# Patient Record
Sex: Male | Born: 1970 | Hispanic: No | Smoking: Never smoker
Health system: Southern US, Community
[De-identification: ages and names within clinical notes are randomized; demographics above are authoritative.]

## PROBLEM LIST (undated history)

## (undated) DIAGNOSIS — G8929 Other chronic pain: Secondary | ICD-10-CM

## (undated) DIAGNOSIS — K219 Gastro-esophageal reflux disease without esophagitis: Secondary | ICD-10-CM

## (undated) DIAGNOSIS — J45909 Unspecified asthma, uncomplicated: Secondary | ICD-10-CM

## (undated) DIAGNOSIS — J309 Allergic rhinitis, unspecified: Secondary | ICD-10-CM

## (undated) DIAGNOSIS — E669 Obesity, unspecified: Secondary | ICD-10-CM

## (undated) DIAGNOSIS — I10 Essential (primary) hypertension: Secondary | ICD-10-CM

## (undated) DIAGNOSIS — M1990 Unspecified osteoarthritis, unspecified site: Secondary | ICD-10-CM

## (undated) DIAGNOSIS — G4730 Sleep apnea, unspecified: Secondary | ICD-10-CM

## (undated) DIAGNOSIS — F419 Anxiety disorder, unspecified: Secondary | ICD-10-CM

## (undated) DIAGNOSIS — G2581 Restless legs syndrome: Secondary | ICD-10-CM

## (undated) DIAGNOSIS — IMO0001 Reserved for inherently not codable concepts without codable children: Secondary | ICD-10-CM

## (undated) DIAGNOSIS — R51 Headache: Secondary | ICD-10-CM

## (undated) DIAGNOSIS — D649 Anemia, unspecified: Secondary | ICD-10-CM

## (undated) DIAGNOSIS — T7840XA Allergy, unspecified, initial encounter: Secondary | ICD-10-CM

## (undated) DIAGNOSIS — I499 Cardiac arrhythmia, unspecified: Secondary | ICD-10-CM

## (undated) DIAGNOSIS — R519 Headache, unspecified: Secondary | ICD-10-CM

## (undated) DIAGNOSIS — M25522 Pain in left elbow: Secondary | ICD-10-CM

## (undated) HISTORY — DX: Obesity, unspecified: E66.9

## (undated) HISTORY — DX: Essential (primary) hypertension: I10

## (undated) HISTORY — DX: Anemia, unspecified: D64.9

## (undated) HISTORY — DX: Unspecified asthma, uncomplicated: J45.909

## (undated) HISTORY — DX: Pain in left elbow: M25.522

## (undated) HISTORY — DX: Sleep apnea, unspecified: G47.30

## (undated) HISTORY — DX: Allergic rhinitis, unspecified: J30.9

## (undated) HISTORY — DX: Restless legs syndrome: G25.81

## (undated) HISTORY — DX: Allergy, unspecified, initial encounter: T78.40XA

## (undated) HISTORY — DX: Anxiety disorder, unspecified: F41.9

## (undated) HISTORY — DX: Other chronic pain: G89.29

---
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
## 1998-10-01 ENCOUNTER — Emergency Department (HOSPITAL_COMMUNITY): Admission: EM | Admit: 1998-10-01 | Discharge: 1998-10-01 | Payer: Self-pay

## 1998-10-04 ENCOUNTER — Emergency Department (HOSPITAL_COMMUNITY): Admission: EM | Admit: 1998-10-04 | Discharge: 1998-10-05 | Payer: Self-pay | Admitting: Emergency Medicine

## 1998-10-05 ENCOUNTER — Encounter (HOSPITAL_COMMUNITY): Admission: RE | Admit: 1998-10-05 | Discharge: 1999-01-03 | Payer: Self-pay | Admitting: Orthopedic Surgery

## 2001-12-02 HISTORY — PX: CRANIOTOMY: SHX93

## 2004-04-30 ENCOUNTER — Emergency Department (HOSPITAL_COMMUNITY): Admission: EM | Admit: 2004-04-30 | Discharge: 2004-05-01 | Payer: Self-pay | Admitting: Emergency Medicine

## 2004-11-29 ENCOUNTER — Emergency Department: Payer: Self-pay | Admitting: Emergency Medicine

## 2005-09-18 ENCOUNTER — Emergency Department: Payer: Self-pay | Admitting: Emergency Medicine

## 2005-09-20 ENCOUNTER — Ambulatory Visit: Payer: Self-pay | Admitting: Urology

## 2005-09-23 ENCOUNTER — Ambulatory Visit: Payer: Self-pay | Admitting: Urology

## 2005-09-26 ENCOUNTER — Ambulatory Visit: Payer: Self-pay | Admitting: Urology

## 2005-12-02 HISTORY — PX: ELBOW FRACTURE SURGERY: SHX616

## 2006-04-27 ENCOUNTER — Emergency Department (HOSPITAL_COMMUNITY): Admission: EM | Admit: 2006-04-27 | Discharge: 2006-04-27 | Payer: Self-pay | Admitting: Emergency Medicine

## 2006-05-17 ENCOUNTER — Emergency Department: Payer: Self-pay | Admitting: Unknown Physician Specialty

## 2006-05-21 ENCOUNTER — Ambulatory Visit: Payer: Self-pay | Admitting: Unknown Physician Specialty

## 2006-05-22 ENCOUNTER — Ambulatory Visit: Payer: Self-pay | Admitting: Unknown Physician Specialty

## 2007-10-29 ENCOUNTER — Emergency Department (HOSPITAL_COMMUNITY): Admission: EM | Admit: 2007-10-29 | Discharge: 2007-10-29 | Payer: Self-pay | Admitting: Emergency Medicine

## 2008-05-14 ENCOUNTER — Emergency Department: Payer: Self-pay | Admitting: Emergency Medicine

## 2008-05-15 ENCOUNTER — Ambulatory Visit: Payer: Self-pay | Admitting: Emergency Medicine

## 2008-09-29 ENCOUNTER — Emergency Department (HOSPITAL_COMMUNITY): Admission: EM | Admit: 2008-09-29 | Discharge: 2008-09-29 | Payer: Self-pay | Admitting: Emergency Medicine

## 2010-01-04 ENCOUNTER — Encounter
Admission: RE | Admit: 2010-01-04 | Discharge: 2010-01-04 | Payer: Self-pay | Admitting: Physical Medicine & Rehabilitation

## 2010-03-12 ENCOUNTER — Emergency Department: Payer: Self-pay | Admitting: Emergency Medicine

## 2010-10-25 ENCOUNTER — Emergency Department: Payer: Self-pay | Admitting: Emergency Medicine

## 2011-05-20 ENCOUNTER — Ambulatory Visit: Payer: Self-pay | Admitting: General Practice

## 2011-10-22 ENCOUNTER — Ambulatory Visit: Payer: Self-pay | Admitting: Urology

## 2011-10-28 ENCOUNTER — Ambulatory Visit: Payer: Self-pay | Admitting: Urology

## 2011-11-04 ENCOUNTER — Ambulatory Visit: Payer: Self-pay | Admitting: Urology

## 2011-12-16 ENCOUNTER — Ambulatory Visit: Payer: Self-pay | Admitting: Surgery

## 2011-12-20 ENCOUNTER — Ambulatory Visit: Payer: Self-pay | Admitting: Surgery

## 2012-04-02 ENCOUNTER — Ambulatory Visit: Payer: Self-pay | Admitting: General Practice

## 2013-04-19 ENCOUNTER — Emergency Department: Payer: Self-pay | Admitting: Emergency Medicine

## 2014-04-28 ENCOUNTER — Ambulatory Visit: Payer: Self-pay | Admitting: Gastroenterology

## 2014-04-28 HISTORY — PX: COLONOSCOPY WITH ESOPHAGOGASTRODUODENOSCOPY (EGD): SHX5779

## 2014-06-29 ENCOUNTER — Ambulatory Visit: Payer: Self-pay | Admitting: Internal Medicine

## 2014-07-02 ENCOUNTER — Ambulatory Visit: Payer: Self-pay | Admitting: Internal Medicine

## 2014-08-02 ENCOUNTER — Ambulatory Visit: Payer: Self-pay | Admitting: Internal Medicine

## 2014-08-24 DIAGNOSIS — I10 Essential (primary) hypertension: Secondary | ICD-10-CM | POA: Insufficient documentation

## 2014-08-24 DIAGNOSIS — I471 Supraventricular tachycardia: Secondary | ICD-10-CM | POA: Insufficient documentation

## 2014-08-25 DIAGNOSIS — E782 Mixed hyperlipidemia: Secondary | ICD-10-CM | POA: Insufficient documentation

## 2014-09-20 ENCOUNTER — Ambulatory Visit: Payer: Self-pay | Admitting: Internal Medicine

## 2014-09-21 LAB — CBC CANCER CENTER
Basophil #: 0.1 x10 3/mm (ref 0.0–0.1)
Basophil %: 1.2 %
Eosinophil #: 0.1 x10 3/mm (ref 0.0–0.7)
Eosinophil %: 3 %
HCT: 39.6 % — ABNORMAL LOW (ref 40.0–52.0)
HGB: 12.5 g/dL — ABNORMAL LOW (ref 13.0–18.0)
Lymphocyte #: 1.5 x10 3/mm (ref 1.0–3.6)
Lymphocyte %: 29.1 %
MCH: 26 pg (ref 26.0–34.0)
MCHC: 31.5 g/dL — ABNORMAL LOW (ref 32.0–36.0)
MCV: 82 fL (ref 80–100)
Monocyte #: 0.5 x10 3/mm (ref 0.2–1.0)
Monocyte %: 9.4 %
Neutrophil #: 2.9 x10 3/mm (ref 1.4–6.5)
Neutrophil %: 57.3 %
Platelet: 188 x10 3/mm (ref 150–440)
RBC: 4.8 10*6/uL (ref 4.40–5.90)
RDW: 20.4 % — ABNORMAL HIGH (ref 11.5–14.5)
WBC: 5 x10 3/mm (ref 3.8–10.6)

## 2014-09-21 LAB — IRON AND TIBC
Iron Bind.Cap.(Total): 427 ug/dL (ref 250–450)
Iron Saturation: 9 %
Iron: 37 ug/dL — ABNORMAL LOW (ref 65–175)
Unbound Iron-Bind.Cap.: 390 ug/dL

## 2014-09-21 LAB — FERRITIN: Ferritin (ARMC): 7 ng/mL — ABNORMAL LOW (ref 8–388)

## 2014-09-21 LAB — FOLATE: Folic Acid: 18.8 ng/mL (ref 3.1–100.0)

## 2014-10-02 ENCOUNTER — Ambulatory Visit: Payer: Self-pay | Admitting: Internal Medicine

## 2014-11-01 ENCOUNTER — Ambulatory Visit: Payer: Self-pay | Admitting: Internal Medicine

## 2014-12-27 ENCOUNTER — Ambulatory Visit: Payer: Self-pay | Admitting: Internal Medicine

## 2014-12-27 LAB — IRON AND TIBC
Iron Bind.Cap.(Total): 395 ug/dL (ref 250–450)
Iron Saturation: 20 %
Iron: 78 ug/dL (ref 65–175)
Unbound Iron-Bind.Cap.: 317 ug/dL

## 2014-12-27 LAB — CBC CANCER CENTER
Basophil #: 0.1 x10 3/mm (ref 0.0–0.1)
Basophil %: 1 %
Eosinophil #: 0.2 x10 3/mm (ref 0.0–0.7)
Eosinophil %: 2.9 %
HCT: 42.3 % (ref 40.0–52.0)
HGB: 13.6 g/dL (ref 13.0–18.0)
Lymphocyte #: 1.5 x10 3/mm (ref 1.0–3.6)
Lymphocyte %: 22.7 %
MCH: 28.9 pg (ref 26.0–34.0)
MCHC: 32.3 g/dL (ref 32.0–36.0)
MCV: 90 fL (ref 80–100)
Monocyte #: 0.6 x10 3/mm (ref 0.2–1.0)
Monocyte %: 8.7 %
Neutrophil #: 4.3 x10 3/mm (ref 1.4–6.5)
Neutrophil %: 64.7 %
Platelet: 202 x10 3/mm (ref 150–440)
RBC: 4.72 10*6/uL (ref 4.40–5.90)
RDW: 15.4 % — ABNORMAL HIGH (ref 11.5–14.5)
WBC: 6.7 x10 3/mm (ref 3.8–10.6)

## 2014-12-27 LAB — FERRITIN: Ferritin (ARMC): 31 ng/mL (ref 8–388)

## 2015-01-02 ENCOUNTER — Ambulatory Visit: Payer: Self-pay | Admitting: Internal Medicine

## 2015-05-12 ENCOUNTER — Other Ambulatory Visit: Payer: Self-pay

## 2015-05-12 MED ORDER — ONDANSETRON 4 MG PO TBDP: 4.0000 mg | ORAL_TABLET | Freq: Four times a day (QID) | ORAL | Status: DC | PRN

## 2015-05-12 MED ORDER — RIZATRIPTAN BENZOATE 10 MG PO TBDP: ORAL_TABLET | ORAL | Status: DC

## 2015-05-12 NOTE — Telephone Encounter (Signed)
Pt needs appt if having this many migraines

## 2015-05-22 DIAGNOSIS — J45909 Unspecified asthma, uncomplicated: Secondary | ICD-10-CM | POA: Insufficient documentation

## 2015-05-22 DIAGNOSIS — J309 Allergic rhinitis, unspecified: Secondary | ICD-10-CM | POA: Insufficient documentation

## 2015-05-22 DIAGNOSIS — D509 Iron deficiency anemia, unspecified: Secondary | ICD-10-CM | POA: Insufficient documentation

## 2015-05-22 DIAGNOSIS — E669 Obesity, unspecified: Secondary | ICD-10-CM | POA: Insufficient documentation

## 2015-05-22 DIAGNOSIS — G43909 Migraine, unspecified, not intractable, without status migrainosus: Secondary | ICD-10-CM | POA: Insufficient documentation

## 2015-05-22 DIAGNOSIS — I10 Essential (primary) hypertension: Secondary | ICD-10-CM

## 2015-05-22 DIAGNOSIS — G2581 Restless legs syndrome: Secondary | ICD-10-CM | POA: Insufficient documentation

## 2015-05-22 DIAGNOSIS — G4733 Obstructive sleep apnea (adult) (pediatric): Secondary | ICD-10-CM | POA: Insufficient documentation

## 2015-05-22 DIAGNOSIS — F411 Generalized anxiety disorder: Secondary | ICD-10-CM | POA: Insufficient documentation

## 2015-05-22 DIAGNOSIS — F419 Anxiety disorder, unspecified: Secondary | ICD-10-CM

## 2015-05-22 DIAGNOSIS — G43109 Migraine with aura, not intractable, without status migrainosus: Secondary | ICD-10-CM

## 2015-05-22 HISTORY — DX: Obesity, unspecified: E66.9

## 2015-05-23 ENCOUNTER — Encounter: Payer: Self-pay | Admitting: Family Medicine

## 2015-05-23 ENCOUNTER — Ambulatory Visit (INDEPENDENT_AMBULATORY_CARE_PROVIDER_SITE_OTHER): Payer: No Typology Code available for payment source | Admitting: Family Medicine

## 2015-05-23 VITALS — BP 128/90 | HR 76 | Temp 98.5°F | Wt 261.0 lb

## 2015-05-23 DIAGNOSIS — G4733 Obstructive sleep apnea (adult) (pediatric): Secondary | ICD-10-CM | POA: Diagnosis not present

## 2015-05-23 DIAGNOSIS — D509 Iron deficiency anemia, unspecified: Secondary | ICD-10-CM

## 2015-05-23 DIAGNOSIS — G43109 Migraine with aura, not intractable, without status migrainosus: Secondary | ICD-10-CM

## 2015-05-23 DIAGNOSIS — R5382 Chronic fatigue, unspecified: Secondary | ICD-10-CM

## 2015-05-23 DIAGNOSIS — R110 Nausea: Secondary | ICD-10-CM

## 2015-05-23 DIAGNOSIS — E669 Obesity, unspecified: Secondary | ICD-10-CM

## 2015-05-23 DIAGNOSIS — G2581 Restless legs syndrome: Secondary | ICD-10-CM

## 2015-05-23 DIAGNOSIS — I10 Essential (primary) hypertension: Secondary | ICD-10-CM

## 2015-05-23 MED ORDER — ONDANSETRON 4 MG PO TBDP: 4.0000 mg | ORAL_TABLET | Freq: Four times a day (QID) | ORAL | Status: DC | PRN

## 2015-05-23 NOTE — Assessment & Plan Note (Signed)
Check ferritin and CBC today; saw hematologist and was monitored there; he is on iron therapy; he had colonoscopy already and EGD

## 2015-05-23 NOTE — Assessment & Plan Note (Signed)
Doing okay on current medicine; check ferritin because lower levels may exacerbate the RLS

## 2015-05-23 NOTE — Assessment & Plan Note (Signed)
Did lose some weight since previous visit; encouraged healthy eating, not skipping meals, hydration, etc. And increasing steps to 6000 a day most days

## 2015-05-23 NOTE — Progress Notes (Signed)
BP 128/90 mmHg  Pulse 76  Temp(Src) 98.5 F (36.9 C)  Wt 261 lb (118.389 kg)  SpO2 97%   Subjective:    Patient ID: Tanner Quan., male    DOB: [birth year], 44 y.o.   MRN: [id number]  HPI: Tanner Ryan is a 44 y.o. male  Chief Complaint  Patient presents with  . Hypertension  . Nausea    gallbladder   He had the tests done a few years ago for similar symptoms and was told that his gallbladder doesn't work; doesn't squeeze like it should; he did not mention any stones; review of [place] chart shows visits with Dr. Pat Patrick and Dr. Leanora Cover listed and radiology procedures, but I cannot pull up the actual readings or images; these were from 2014 When he eats spicy or greasy foods, he gets nauseated about an hour or so later Stool is normal color and normal caliber  He is on a beta-blocker for HTN, on that med over a year; notices when he mows the yard; he has a fitbit; 2500-3500 steps per day, some days over 6000; he checks his BP away every once in a while; gets around 120/80 or 120/90 Handles stress about 70% of the time well, does not use much salt; checks labels now at grocery store  Lowest wt since high school around 245 pounds as an adult; mother's brothers all stocky, carries after that side of the family  He has sleep apnea, using CPAP  Relevant past medical, surgical, family and social history reviewed and updated as indicated. Interim medical history since our last visit reviewed. Mother has thyroid disease Allergies and medications reviewed and updated.  Review of Systems  Constitutional: Positive for fatigue. Negative for fever.  Gastrointestinal: Positive for nausea and vomiting. Negative for abdominal pain, diarrhea, constipation, blood in stool and abdominal distention.  Endocrine: Negative for polydipsia and polyuria.  Skin: Negative for color change.  Hematological: Does not bruise/bleed easily.    Per HPI unless specifically indicated above      Objective:    BP 128/90 mmHg  Pulse 76  Temp(Src) 98.5 F (36.9 C)  Wt 261 lb (118.389 kg)  SpO2 97%  Wt Readings from Last 3 Encounters:  05/23/15 261 lb (118.389 kg)  03/22/15 260 lb (117.935 kg)    Physical Exam  Constitutional: He appears well-developed and well-nourished. No distress.  obese  HENT:  Nose: No rhinorrhea.  Mouth/Throat: Mucous membranes are normal.  Chronically dysmorphic  Eyes: EOM are normal. No scleral icterus.  Neck: No JVD present. No thyromegaly present.  Cardiovascular: Normal rate, regular rhythm and normal heart sounds.   No murmur heard. Pulmonary/Chest: Effort normal and breath sounds normal. No respiratory distress. He has no wheezes.  Abdominal: Soft. Normal appearance and bowel sounds are normal. He exhibits no distension. There is no tenderness. There is no guarding, no tenderness at McBurney's point and negative Murphy's sign. No hernia.  Musculoskeletal: He exhibits no edema.  Disfigured left elbow  Neurological: He is alert. He displays no tremor. He exhibits normal muscle tone.  Skin: Skin is warm and dry. No rash noted. He is not diaphoretic. No cyanosis.  Psychiatric: He has a normal mood and affect. His behavior is normal. Judgment and thought content normal.  Good eye contact with examiner, very pleasant  Nursing note and vitals reviewed.   Results for orders placed or performed in visit on 12/27/14  [place]  Result Value Ref Range  WBC 6.7 3.8-10.6 x10 3/mm    RBC 4.72 4.40-5.90 x10 6/mm    HGB 13.6 13.0-18.0 g/dL   HCT 42.3 40.0-52.0 %   MCV 90 80-100 fL   MCH 28.9 26.0-34.0 pg   MCHC 32.3 32.0-36.0 g/dL   RDW 15.4 (H) 11.5-14.5 %   Platelet 202 150-440 x10 3/mm    Neutrophil % 64.7 %   Lymphocyte % 22.7 %   Monocyte % 8.7 %   Eosinophil % 2.9 %   Basophil % 1.0 %   Neutrophil # 4.3 1.4-6.5 x10 3/mm    Lymphocyte # 1.5 1.0-3.6 x10 3/mm    Monocyte # 0.6 0.2-1.0 x10 3/mm    Eosinophil # 0.2 0.0-0.7 x10 3/mm     Basophil # 0.1 0.0-0.1 x10 3/mm   Ferritin  Result Value Ref Range   Ferritin ([facility]) 31 8-388 ng/mL  Iron and TIBC  Result Value Ref Range   Iron 78 65-175 mcg/dL   Iron Bind.Cap.(Total) 395 250-450 mcg/dL   Unbound Iron-Bind.Cap. 317 mcg/dL   Iron Saturation 20 %      Assessment & Plan:   Problem List Items Addressed This Visit      Cardiovascular and Mediastinum   Benign hypertension    Continue the current meds; weight loss and DASH guidelines will help; he does not want to stop the beta-blocker so we'll continue what he's currently taking; if he loses weight, I'd suggest decreasing the HCTZ to half of a pill daily and then we could potentially stop it altogether      Migraine    Do limit triggers; continue the abortive therapy as needed        Respiratory   Obstructive sleep apnea    Using CPAP        Other   RLS (restless legs syndrome)    Doing okay on current medicine; check ferritin because lower levels may exacerbate the RLS      Obesity    Did lose some weight since previous visit; encouraged healthy eating, not skipping meals, hydration, etc. And increasing steps to 6000 a day most days      Relevant Orders   Lipid Panel w/o Chol/HDL Ratio   Microcytic hypochromic anemia    Check ferritin and CBC today; saw hematologist and was monitored there; he is on iron therapy; he had colonoscopy already and EGD      Relevant Orders   CBC with Differential/Platelet   Ferritin    Other Visit Diagnoses    Postprandial nausea    -  Primary    already worked up by Psychologist, sport and exercise in 2014; symptoms persist, avoid greasy, spicy foods and refer back to surgeon    Relevant Orders    Ambulatory referral to General Surgery    Comprehensive metabolic panel    Chronic fatigue        checking TSH today    Relevant Orders    TSH       Follow up plan: Return in about 6 months (around 11/22/2015) for regular follow-up.

## 2015-05-23 NOTE — Patient Instructions (Addendum)
Your goal blood pressure is less than 140 on top, less than 90 on the bottom Try to follow the DASH guidelines (DASH stands for Dietary Approaches to Stop Hypertension) Try to limit the sodium in your diet.  Ideally, consume less than 1.5 grams (less than 1,500mg ) per day. Do not add salt when cooking or at the table.  Check the sodium amount on labels when shopping, and choose items lower in sodium when given a choice. Avoid or limit foods that already contain a lot of sodium. Eat a diet rich in fruits and vegetables and whole grains.  Check out the information at familydoctor.org entitled "What It Takes to Lose Weight" Try to lose between 1-2 pounds per week by taking in fewer calories and burning off more calories You can succeed by limiting portions, limiting foods dense in calories and fat, becoming more active, and drinking 8 glasses of water a day Don't skip meals, especially breakfast, as skipping meals may alter your metabolism Do not use over-the-counter weight loss pills or gimmicks that claim rapid weight loss  Avoid greasy and spicy foods; I've put in referral to surgeon If symptoms worsen, go to the ER  DASH Eating Plan DASH stands for "Dietary Approaches to Stop Hypertension." The DASH eating plan is a healthy eating plan that has been shown to reduce high blood pressure (hypertension). Additional health benefits may include reducing the risk of type 2 diabetes mellitus, heart disease, and stroke. The DASH eating plan may also help with weight loss. WHAT DO I NEED TO KNOW ABOUT THE DASH EATING PLAN? For the DASH eating plan, you will follow these general guidelines:  Choose foods with a percent daily value for sodium of less than 5% (as listed on the food label).  Use salt-free seasonings or herbs instead of table salt or sea salt.  Check with your health care provider or pharmacist before using salt substitutes.  Eat lower-sodium products, often labeled as "lower sodium" or  "no salt added."  Eat fresh foods.  Eat more vegetables, fruits, and low-fat dairy products.  Choose whole grains. Look for the word "whole" as the first word in the ingredient list.  Choose fish and skinless chicken or [place] more often than red meat. Limit fish, poultry, and meat to 6 oz (170 g) each day.  Limit sweets, desserts, sugars, and sugary drinks.  Choose heart-healthy fats.  Limit cheese to 1 oz (28 g) per day.  Eat more home-cooked food and less restaurant, buffet, and fast food.  Limit fried foods.  Cook foods using methods other than frying.  Limit canned vegetables. If you do use them, rinse them well to decrease the sodium.  When eating at a restaurant, ask that your food be prepared with less salt, or no salt if possible. WHAT FOODS CAN I EAT? Seek help from a dietitian for individual calorie needs. Grains Whole grain or whole wheat bread. Brown rice. Whole grain or whole wheat pasta. Quinoa, bulgur, and whole grain cereals. Low-sodium cereals. Corn or whole wheat flour tortillas. Whole grain cornbread. Whole grain crackers. Low-sodium crackers. Vegetables Fresh or frozen vegetables (raw, steamed, roasted, or grilled). Low-sodium or reduced-sodium tomato and vegetable juices. Low-sodium or reduced-sodium tomato sauce and paste. Low-sodium or reduced-sodium canned vegetables.  Fruits All fresh, canned (in natural juice), or frozen fruits. Meat and Other Protein Products Ground beef (85% or leaner), grass-fed beef, or beef trimmed of fat. Skinless chicken or [place]. Ground chicken or [place]. Pork trimmed of fat. All  fish and seafood. Eggs. Dried beans, peas, or lentils. Unsalted nuts and seeds. Unsalted canned beans. Dairy Low-fat dairy products, such as skim or 1% milk, 2% or reduced-fat cheeses, low-fat ricotta or cottage cheese, or plain low-fat yogurt. Low-sodium or reduced-sodium cheeses. Fats and Oils Tub margarines without trans fats. Light or reduced-fat  mayonnaise and salad dressings (reduced sodium). Avocado. Safflower, olive, or canola oils. Natural peanut or almond butter. Other Unsalted popcorn and pretzels. The items listed above may not be a complete list of recommended foods or beverages. Contact your dietitian for more options. WHAT FOODS ARE NOT RECOMMENDED? Grains White bread. White pasta. White rice. Refined cornbread. Bagels and croissants. Crackers that contain trans fat. Vegetables Creamed or fried vegetables. Vegetables in a cheese sauce. Regular canned vegetables. Regular canned tomato sauce and paste. Regular tomato and vegetable juices. Fruits Dried fruits. Canned fruit in light or heavy syrup. Fruit juice. Meat and Other Protein Products Fatty cuts of meat. Ribs, chicken wings, bacon, sausage, bologna, salami, chitterlings, fatback, hot dogs, bratwurst, and packaged luncheon meats. Salted nuts and seeds. Canned beans with salt. Dairy Whole or 2% milk, cream, half-and-half, and cream cheese. Whole-fat or sweetened yogurt. Full-fat cheeses or blue cheese. Nondairy creamers and whipped toppings. Processed cheese, cheese spreads, or cheese curds. Condiments Onion and garlic salt, seasoned salt, table salt, and sea salt. Canned and packaged gravies. Worcestershire sauce. Tartar sauce. Barbecue sauce. Teriyaki sauce. Soy sauce, including reduced sodium. Steak sauce. Fish sauce. Oyster sauce. Cocktail sauce. Horseradish. Ketchup and mustard. Meat flavorings and tenderizers. Bouillon cubes. Hot sauce. Tabasco sauce. Marinades. Taco seasonings. Relishes. Fats and Oils Butter, stick margarine, lard, shortening, ghee, and bacon fat. Coconut, palm kernel, or palm oils. Regular salad dressings. Other Pickles and olives. Salted popcorn and pretzels. The items listed above may not be a complete list of foods and beverages to avoid. Contact your dietitian for more information. WHERE CAN I FIND MORE INFORMATION? National Heart, Lung, and  Blood Institute: travelstabloid.com Document Released: 11/07/2011 Document Revised: 04/04/2014 Document Reviewed: 09/22/2013 [facility] Patient Information 2015 [place], [place]. This information is not intended to replace advice given to you by your health care provider. Make sure you discuss any questions you have with your health care provider.

## 2015-05-23 NOTE — Assessment & Plan Note (Signed)
Do limit triggers; continue the abortive therapy as needed

## 2015-05-23 NOTE — Assessment & Plan Note (Signed)
Using CPAP 

## 2015-05-23 NOTE — Assessment & Plan Note (Addendum)
Continue the current meds; weight loss and DASH guidelines will help; he does not want to stop the beta-blocker so we'll continue what he's currently taking; if he loses weight, I'd suggest decreasing the HCTZ to half of a pill daily and then we could potentially stop it altogether

## 2015-05-24 ENCOUNTER — Encounter: Payer: Self-pay | Admitting: Family Medicine

## 2015-05-24 LAB — LIPID PANEL W/O CHOL/HDL RATIO
Cholesterol, Total: 177 mg/dL (ref 100–199)
HDL: 40 mg/dL (ref >39)
LDL Calculated: 123 mg/dL — ABNORMAL HIGH (ref 0–99)
Triglycerides: 69 mg/dL (ref 0–149)
VLDL Cholesterol Cal: 14 mg/dL (ref 5–40)

## 2015-05-24 LAB — COMPREHENSIVE METABOLIC PANEL
A/G RATIO: 1.7 (ref 1.1–2.5)
ALT: 20 IU/L (ref 0–44)
AST: 22 IU/L (ref 0–40)
Albumin: 4.3 g/dL (ref 3.5–5.5)
Alkaline Phosphatase: 75 IU/L (ref 39–117)
BILIRUBIN TOTAL: 0.7 mg/dL (ref 0.0–1.2)
BUN/Creatinine Ratio: 8 — ABNORMAL LOW (ref 9–20)
BUN: 8 mg/dL (ref 6–24)
CALCIUM: 9.4 mg/dL (ref 8.7–10.2)
CO2: 28 mmol/L (ref 18–29)
Chloride: 97 mmol/L (ref 97–108)
Creatinine, Ser: 0.99 mg/dL (ref 0.76–1.27)
GFR calc Af Amer: 107 mL/min/{1.73_m2} (ref >59)
GFR, EST NON AFRICAN AMERICAN: 93 mL/min/{1.73_m2} (ref >59)
GLUCOSE: 86 mg/dL (ref 65–99)
Globulin, Total: 2.6 g/dL (ref 1.5–4.5)
Potassium: 4.7 mmol/L (ref 3.5–5.2)
SODIUM: 140 mmol/L (ref 134–144)
Total Protein: 6.9 g/dL (ref 6.0–8.5)

## 2015-05-24 LAB — CBC WITH DIFFERENTIAL/PLATELET
Basophils Absolute: 0 10*3/uL (ref 0.0–0.2)
Basos: 1 %
EOS (ABSOLUTE): 0.1 10*3/uL (ref 0.0–0.4)
Eos: 2 %
Hematocrit: 42.4 % (ref 37.5–51.0)
Hemoglobin: 13.9 g/dL (ref 12.6–17.7)
IMMATURE GRANS (ABS): 0 10*3/uL (ref 0.0–0.1)
IMMATURE GRANULOCYTES: 0 %
Lymphocytes Absolute: 1.7 10*3/uL (ref 0.7–3.1)
Lymphs: 25 %
MCH: 28.1 pg (ref 26.6–33.0)
MCHC: 32.8 g/dL (ref 31.5–35.7)
MCV: 86 fL (ref 79–97)
MONOS ABS: 0.6 10*3/uL (ref 0.1–0.9)
Monocytes: 9 %
NEUTROS PCT: 63 %
Neutrophils Absolute: 4.4 10*3/uL (ref 1.4–7.0)
PLATELETS: 237 10*3/uL (ref 150–379)
RBC: 4.94 x10E6/uL (ref 4.14–5.80)
RDW: 15.3 % (ref 12.3–15.4)
WBC: 6.9 10*3/uL (ref 3.4–10.8)

## 2015-05-24 LAB — TSH: TSH: 1.13 u[IU]/mL (ref 0.450–4.500)

## 2015-05-24 LAB — FERRITIN: Ferritin: 20 ng/mL — ABNORMAL LOW (ref 30–400)

## 2015-05-30 ENCOUNTER — Telehealth: Payer: Self-pay

## 2015-05-30 MED ORDER — ONDANSETRON 4 MG PO TBDP: 4.0000 mg | ORAL_TABLET | Freq: Four times a day (QID) | ORAL | Status: DC | PRN

## 2015-05-30 NOTE — Telephone Encounter (Signed)
They denied the prior auth for the Zofan, he can not get more than 15 tabs a month. Can you change rx to a qty of 15 instead of 30 and we will see if that works.

## 2015-06-04 ENCOUNTER — Other Ambulatory Visit: Payer: Self-pay | Admitting: Family Medicine

## 2015-06-06 ENCOUNTER — Ambulatory Visit (INDEPENDENT_AMBULATORY_CARE_PROVIDER_SITE_OTHER): Payer: No Typology Code available for payment source | Admitting: Surgery

## 2015-06-06 ENCOUNTER — Encounter: Payer: Self-pay | Admitting: Surgery

## 2015-06-06 VITALS — BP 131/80 | HR 74 | Temp 98.4°F | Resp 20 | Ht 68.0 in | Wt 264.0 lb

## 2015-06-06 DIAGNOSIS — K828 Other specified diseases of gallbladder: Secondary | ICD-10-CM | POA: Diagnosis not present

## 2015-06-06 MED ORDER — RIZATRIPTAN BENZOATE 10 MG PO TBDP: ORAL_TABLET | ORAL | Status: DC

## 2015-06-06 NOTE — Progress Notes (Signed)
Outpatient Surgical Follow Up  06/06/2015  Tanner Ryan. is an 44 y.o. male.   CC: Nausea and vomiting  HPI: Patient has had recurrent and episodic nausea and vomiting with occasional abdominal pain. The pain is minimal and is located in the left upper quadrant. He denies any right upper quadrant pain.  He had a workup in the past 2013, for biliary dyskinesia. I cannot access the ejection fraction information. But it sounds as if from Dr. Delice Lesch notes that he has biliary dyskinesia and surgery was offered if his symptoms warranted.  His symptoms are occurring twice a week have not caused him to miss any work. Denies jaundice or acholic stools no melena or hematochezia. Past Medical History  Diagnosis Date  . Asthma   . Hypertension   . Sleep apnea   . Chronic pain of left elbow   . Allergy   . Anemia iron deficiency  . Anxiety   . Obesity   . RLS (restless legs syndrome)     Past Surgical History  Procedure Laterality Date  . Elbow fracture surgery  2007  . Tonsillectomy and adenoidectomy  1978  . Craniotomy  2003  . Colonoscopy with esophagogastroduodenoscopy (egd)  04/28/2014    Family History  Problem Relation Age of Onset  . Osteoporosis Mother   . Heart attack Father     Social History:  reports that he has never smoked. He has never used smokeless tobacco. He reports that he does not drink alcohol or use illicit drugs.  Allergies:  Allergies  Allergen Reactions  . Penicillins     Medications reviewed.   Review of Systems:   Review of Systems  Constitutional: Negative for fever and chills.  HENT: Negative.   Eyes: Negative.   Respiratory: Negative.   Cardiovascular: Negative.   Gastrointestinal: Positive for nausea, vomiting and abdominal pain. Negative for heartburn, diarrhea, constipation, blood in stool and melena.  Genitourinary: Negative.   Musculoskeletal: Negative.   Skin: Negative for rash.  Neurological: Negative.    Endo/Heme/Allergies: Negative.   Psychiatric/Behavioral: Negative.      Physical Exam:  BP 131/80 mmHg  Pulse 74  Temp(Src) 98.4 F (36.9 C) (Oral)  Resp 20  Ht 5\' 8"  (1.727 m)  Wt 264 lb (119.75 kg)  BMI 40.15 kg/m2  Physical Exam  Constitutional: He is oriented to person, place, and time and well-developed, well-nourished, and in no distress.  HENT:  Head: Normocephalic and atraumatic.  Eyes: No scleral icterus.  Neck: Normal range of motion. Neck supple.  Cardiovascular: Normal rate, regular rhythm and normal heart sounds.   Pulmonary/Chest: Effort normal and breath sounds normal. No respiratory distress. He has no wheezes. He has no rales.  Abdominal: Soft. Bowel sounds are normal. He exhibits no distension. There is no tenderness. There is no rebound and no guarding.  Musculoskeletal: Normal range of motion.  Left elbow deformity following surgery and infection.  Lymphadenopathy:    He has no cervical adenopathy.  Neurological: He is alert and oriented to person, place, and time.  Skin: Skin is warm and dry.  Psychiatric: Mood and affect normal.      No results found for this or any previous visit (from the past 48 hour(s)). No results found.  Assessment/Plan: Probable biliary dyskinesia. It has been 3 years since his last studies and without in mind I will repeat his HIDA scan with CCK. In all likelihood after I see him back in the office we will be scheduling laparoscopic  cholecystectomy.  Florene Glen, MD, FACS

## 2015-06-06 NOTE — Telephone Encounter (Signed)
Routing to provider  

## 2015-06-15 ENCOUNTER — Other Ambulatory Visit: Payer: Self-pay

## 2015-06-15 DIAGNOSIS — R1011 Right upper quadrant pain: Secondary | ICD-10-CM

## 2015-06-20 ENCOUNTER — Inpatient Hospital Stay: Payer: PRIVATE HEALTH INSURANCE | Attending: Internal Medicine

## 2015-06-20 DIAGNOSIS — D509 Iron deficiency anemia, unspecified: Secondary | ICD-10-CM | POA: Diagnosis not present

## 2015-06-20 LAB — FERRITIN: FERRITIN: 9 ng/mL — AB (ref 24–336)

## 2015-06-20 LAB — HEMOGLOBIN: Hemoglobin: 13.5 g/dL (ref 13.0–18.0)

## 2015-06-20 LAB — IRON AND TIBC
Iron: 80 ug/dL (ref 45–182)
SATURATION RATIOS: 16 % — AB (ref 17.9–39.5)
TIBC: 494 ug/dL — ABNORMAL HIGH (ref 250–450)
UIBC: 415 ug/dL

## 2015-06-22 ENCOUNTER — Encounter: Payer: Self-pay | Admitting: Family Medicine

## 2015-06-23 ENCOUNTER — Other Ambulatory Visit: Payer: Self-pay | Admitting: Family Medicine

## 2015-06-23 MED ORDER — TRAZODONE HCL 50 MG PO TABS
50.0000 mg | ORAL_TABLET | Freq: Every evening | ORAL | Status: DC | PRN
Start: 2015-06-23 — End: 2016-04-02

## 2015-06-26 ENCOUNTER — Encounter
Admission: RE | Admit: 2015-06-26 | Discharge: 2015-06-26 | Disposition: A | Discharge status: Home / Self Care | Payer: PRIVATE HEALTH INSURANCE | Source: Ambulatory Visit | Attending: Surgery | Admitting: Surgery

## 2015-06-26 DIAGNOSIS — R1011 Right upper quadrant pain: Secondary | ICD-10-CM

## 2015-06-26 MED ORDER — TECHNETIUM TC 99M MEBROFENIN IV KIT
5.0000 | PACK | Freq: Once | INTRAVENOUS | Status: AC | PRN
  Administered 2015-06-26: 5.36 via INTRAVENOUS

## 2015-06-26 MED ORDER — SINCALIDE 5 MCG IJ SOLR
0.0200 ug/kg | Freq: Once | INTRAMUSCULAR | Status: AC
  Administered 2015-06-26: 2.4 ug via INTRAVENOUS

## 2015-06-28 ENCOUNTER — Ambulatory Visit: Payer: No Typology Code available for payment source | Admitting: Surgery

## 2015-06-29 ENCOUNTER — Encounter: Payer: Self-pay | Admitting: Surgery

## 2015-06-29 ENCOUNTER — Ambulatory Visit (INDEPENDENT_AMBULATORY_CARE_PROVIDER_SITE_OTHER): Payer: PRIVATE HEALTH INSURANCE | Admitting: Surgery

## 2015-06-29 VITALS — BP 147/98 | HR 62 | Temp 98.4°F | Ht 68.0 in | Wt 263.6 lb

## 2015-06-29 DIAGNOSIS — K828 Other specified diseases of gallbladder: Secondary | ICD-10-CM | POA: Diagnosis not present

## 2015-06-29 NOTE — Progress Notes (Signed)
Outpatient Surgical Follow Up  06/29/2015  Tanner Ryan. is an 44 y.o. male.   CC: Recurrent right upper quadrant pain  HPI: Patient is in the process of being evaluated for what is likely biliary dyskinesia with a low ejection fraction on HIDA CCK. He has had recurrent and episodic right upper quadrant pain associated with fatty food intolerance and was previously considered for surgery by Dr. Leanora Cover.  He had an attack on Tuesday which caused him nausea vomiting and pain. He denies jaundice or acholic stools. And no fevers. This was in relation to fatty food intolerance.  Past Medical History  Diagnosis Date  . Asthma   . Hypertension   . Sleep apnea   . Chronic pain of left elbow   . Allergy   . Anemia iron deficiency  . Anxiety   . Obesity   . RLS (restless legs syndrome)     Past Surgical History  Procedure Laterality Date  . Elbow fracture surgery  2007  . Tonsillectomy and adenoidectomy  1978  . Craniotomy  2003  . Colonoscopy with esophagogastroduodenoscopy (egd)  04/28/2014    Family History  Problem Relation Age of Onset  . Osteoporosis Mother   . Heart attack Father     Social History:  reports that he has never smoked. He has never used smokeless tobacco. He reports that he does not drink alcohol or use illicit drugs.  Allergies:  Allergies  Allergen Reactions  . Penicillins     Medications reviewed.   Review of Systems:   Review of Systems  Constitutional: Negative for fever, chills and weight loss.  HENT: Negative.   Eyes: Negative.   Respiratory: Negative for cough, hemoptysis, shortness of breath and wheezing.   Cardiovascular: Negative for chest pain, palpitations and leg swelling.  Gastrointestinal: Positive for nausea, vomiting and abdominal pain. Negative for heartburn, diarrhea, constipation, blood in stool and melena.  Genitourinary: Negative.   Musculoskeletal: Negative.   Neurological: Negative.   Endo/Heme/Allergies:  Negative.   Psychiatric/Behavioral: Negative.      Physical Exam:  Temp(Src) 98.4 F (36.9 C) (Oral)  Ht 5\' 8"  (1.727 m)  Wt 263 lb 9.6 oz (119.568 kg)  BMI 40.09 kg/m2  Physical Exam  Constitutional: He is oriented to person, place, and time and well-developed, well-nourished, and in no distress.  HENT:  Head: Normocephalic and atraumatic.  Eyes: No scleral icterus.  Neck: Normal range of motion. Neck supple.  Cardiovascular: Normal rate and regular rhythm.   Pulmonary/Chest: Effort normal and breath sounds normal. No respiratory distress. He has no wheezes. He has no rales.  Abdominal: Soft. Bowel sounds are normal.  Musculoskeletal: Normal range of motion. He exhibits no edema.  Neurological: He is alert and oriented to person, place, and time.  Skin: Skin is warm and dry.  Psychiatric: Mood, affect and judgment normal.      No results found for this or any previous visit (from the past 48 hour(s)). No results found.  Assessment/Plan:  HIDA scan ejection fraction with CCK was 37%. That report notes that there was no symptoms but I discussed with him and he had some mild symptoms mild in quantity but similar quality to his attacks. He has had an attack this week which caused him vomiting and pain. And it was in relation to fatty foods.  I am recommending laparoscopic cholecystectomy for control of his symptoms I offered this to him and discussed with him the risks of bleeding infection recurrence of symptoms  failure to resolve his symptoms conversion to an open procedure bile duct damage bile duct leak retained common bile duct stone any of which could require further surgery and/or ERCP stent papillotomy this is all reviewed for him he wishes to proceed with surgery at this point.  Florene Glen, MD, FACS

## 2015-06-29 NOTE — Patient Instructions (Signed)
You are requesting to have your gallbladder removed. Our office will contact you with the details of surgery and Pre-admission appointment.  Please call our office with any questions or concerns prior to surgery.

## 2015-06-30 ENCOUNTER — Telehealth: Payer: Self-pay | Admitting: Surgery

## 2015-06-30 NOTE — Telephone Encounter (Signed)
I have left a msg to advise pt of pre op date/time and sx date. Sx: 07/25/15 with Dr. Laqueta Carina chole with gram.  Pre op: Office: 07/18/15 @ 10:45am.

## 2015-07-07 ENCOUNTER — Telehealth: Payer: Self-pay | Admitting: Surgery

## 2015-07-07 NOTE — Telephone Encounter (Signed)
Pt advised of pre op date/time and sx date. Sx: 07/25/15 with Dr Laqueta Carina chole with Gram Pre op: 07/18/15 @ 10:45am.

## 2015-07-18 ENCOUNTER — Encounter
Admission: RE | Admit: 2015-07-18 | Discharge: 2015-07-18 | Disposition: A | Discharge status: Home / Self Care | Payer: PRIVATE HEALTH INSURANCE | Source: Ambulatory Visit | Attending: Surgery | Admitting: Surgery

## 2015-07-18 DIAGNOSIS — Z01812 Encounter for preprocedural laboratory examination: Secondary | ICD-10-CM | POA: Insufficient documentation

## 2015-07-18 DIAGNOSIS — Z01810 Encounter for preprocedural cardiovascular examination: Secondary | ICD-10-CM | POA: Diagnosis present

## 2015-07-18 HISTORY — DX: Gastro-esophageal reflux disease without esophagitis: K21.9

## 2015-07-18 HISTORY — DX: Cardiac arrhythmia, unspecified: I49.9

## 2015-07-18 HISTORY — DX: Reserved for inherently not codable concepts without codable children: IMO0001

## 2015-07-18 HISTORY — DX: Headache, unspecified: R51.9

## 2015-07-18 HISTORY — DX: Headache: R51

## 2015-07-18 HISTORY — DX: Unspecified osteoarthritis, unspecified site: M19.90

## 2015-07-18 LAB — COMPREHENSIVE METABOLIC PANEL
ALT: 38 U/L (ref 17–63)
ANION GAP: 10 (ref 5–15)
AST: 44 U/L — ABNORMAL HIGH (ref 15–41)
Albumin: 4.1 g/dL (ref 3.5–5.0)
Alkaline Phosphatase: 63 U/L (ref 38–126)
BUN: 9 mg/dL (ref 6–20)
CHLORIDE: 100 mmol/L — AB (ref 101–111)
CO2: 31 mmol/L (ref 22–32)
CREATININE: 1.04 mg/dL (ref 0.61–1.24)
Calcium: 9.2 mg/dL (ref 8.9–10.3)
GFR calc Af Amer: >60 mL/min (ref >60)
Glucose, Bld: 69 mg/dL (ref 65–99)
Potassium: 3.3 mmol/L — ABNORMAL LOW (ref 3.5–5.1)
Sodium: 141 mmol/L (ref 135–145)
Total Bilirubin: 0.7 mg/dL (ref 0.3–1.2)
Total Protein: 7.4 g/dL (ref 6.5–8.1)

## 2015-07-18 LAB — CBC WITH DIFFERENTIAL/PLATELET
Basophils Absolute: 0.1 10*3/uL (ref 0–0.1)
Basophils Relative: 1 %
EOS PCT: 3 %
Eosinophils Absolute: 0.2 10*3/uL (ref 0–0.7)
HCT: 44.1 % (ref 40.0–52.0)
Hemoglobin: 13.7 g/dL (ref 13.0–18.0)
LYMPHS ABS: 1.7 10*3/uL (ref 1.0–3.6)
Lymphocytes Relative: 24 %
MCH: 27.5 pg (ref 26.0–34.0)
MCHC: 31.1 g/dL — ABNORMAL LOW (ref 32.0–36.0)
MCV: 88.2 fL (ref 80.0–100.0)
MONO ABS: 0.6 10*3/uL (ref 0.2–1.0)
Monocytes Relative: 8 %
Neutro Abs: 4.7 10*3/uL (ref 1.4–6.5)
Neutrophils Relative %: 64 %
PLATELETS: 228 10*3/uL (ref 150–440)
RBC: 5 MIL/uL (ref 4.40–5.90)
RDW: 16.2 % — ABNORMAL HIGH (ref 11.5–14.5)
WBC: 7.2 10*3/uL (ref 3.8–10.6)

## 2015-07-18 NOTE — Pre-Procedure Instructions (Signed)
Potassium results 3.3 called to Dr. Burt Knack office and given to Amy, informed that patient needed supplement started prior to surgery

## 2015-07-18 NOTE — Patient Instructions (Signed)
  Your procedure is scheduled on: July 25, 2015 (Tuesday) Report to Day Surgery. To find out your arrival time please call [phone] between 1PM - 3PM on July 24, 2015 (Monday).  Remember: Instructions that are not followed completely may result in serious medical risk, up to and including death, or upon the discretion of your surgeon and anesthesiologist your surgery may need to be rescheduled.    __x__ 1. Do not eat food or drink liquids after midnight. No gum chewing or hard candies.     __x__ 2. No Alcohol for 24 hours before or after surgery.   ____ 3. Bring all medications with you on the day of surgery if instructed.    __x__ 4. Notify your doctor if there is any change in your medical condition     (cold, fever, infections).     Do not wear jewelry, make-up, hairpins, clips or nail polish.  Do not wear lotions, powders, or perfumes. You may wear deodorant.  Do not shave 48 hours prior to surgery. Men may shave face and neck.  Do not bring valuables to the hospital.    [facility] is not responsible for any belongings or valuables.               Contacts, dentures or bridgework may not be worn into surgery.  Leave your suitcase in the car. After surgery it may be brought to your room.  For patients admitted to the hospital, discharge time is determined by your                treatment team.   Patients discharged the day of surgery will not be allowed to drive home.   Please read over the following fact sheets that you were given:      ____ Take these medicines the morning of surgery with A SIP OF WATER:    1. Metoprolol  2. Singulair  3. Omeprazole (Omeprazole at bedtime on August 22)  4.  5.  6.  ____ Fleet Enema (as directed)   __x_ Use CHG Soap as directed  __x__ Use inhalers on the day of surgery (Albuterol inhaler, and bring to hospital) ____ Stop metformin 2 days prior to surgery    ____ Take 1/2 of usual insulin dose the night before surgery and  none on the morning of surgery.   ____ Stop Coumadin/Plavix/aspirin on   ____ Stop Anti-inflammatories on    ____ Stop supplements until after surgery.    __x__ Bring C-Pap to the hospital.

## 2015-07-19 ENCOUNTER — Telehealth: Payer: Self-pay

## 2015-07-19 MED ORDER — POTASSIUM CHLORIDE ER 20 MEQ PO TBCR: 20.0000 meq | EXTENDED_RELEASE_TABLET | Freq: Two times a day (BID) | ORAL | Status: DC

## 2015-07-19 NOTE — Telephone Encounter (Signed)
Patient returned phone call at this time. Explained situation and that meds were called to [facility]. He will pick up medications and take Potassium 8/18-8/22. Explained that pre-op will redraw potassium morning of surgery to ensure that it is high enough to complete case.   He verbalizes understanding of all information and will call back if he has any further questions.

## 2015-07-19 NOTE — Telephone Encounter (Signed)
Received abnormal potassium of 3.3 as part of pre-op labs. Patient started on potassium protocol at this time which requires Potassium Chloride 74meq BID x 5 days. Surgery scheduled for 07/25/15 with Dr. Burt Knack.  Medication sent to pharmacy at this time.  Call made to patient. No answer. Left voicemail to return phone call.

## 2015-07-24 ENCOUNTER — Inpatient Hospital Stay: Payer: PRIVATE HEALTH INSURANCE | Attending: Internal Medicine

## 2015-07-24 ENCOUNTER — Other Ambulatory Visit: Payer: Self-pay

## 2015-07-24 ENCOUNTER — Inpatient Hospital Stay (HOSPITAL_BASED_OUTPATIENT_CLINIC_OR_DEPARTMENT_OTHER): Payer: PRIVATE HEALTH INSURANCE | Admitting: Internal Medicine

## 2015-07-24 ENCOUNTER — Telehealth: Payer: Self-pay | Admitting: Family Medicine

## 2015-07-24 ENCOUNTER — Ambulatory Visit: Payer: PRIVATE HEALTH INSURANCE

## 2015-07-24 VITALS — BP 129/87 | HR 87 | Temp 97.4°F | Resp 18 | Ht 68.0 in | Wt 261.5 lb

## 2015-07-24 DIAGNOSIS — F419 Anxiety disorder, unspecified: Secondary | ICD-10-CM

## 2015-07-24 DIAGNOSIS — G4730 Sleep apnea, unspecified: Secondary | ICD-10-CM

## 2015-07-24 DIAGNOSIS — R1011 Right upper quadrant pain: Secondary | ICD-10-CM | POA: Diagnosis not present

## 2015-07-24 DIAGNOSIS — Z79899 Other long term (current) drug therapy: Secondary | ICD-10-CM | POA: Insufficient documentation

## 2015-07-24 DIAGNOSIS — D509 Iron deficiency anemia, unspecified: Secondary | ICD-10-CM

## 2015-07-24 DIAGNOSIS — J45909 Unspecified asthma, uncomplicated: Secondary | ICD-10-CM | POA: Insufficient documentation

## 2015-07-24 DIAGNOSIS — I10 Essential (primary) hypertension: Secondary | ICD-10-CM | POA: Insufficient documentation

## 2015-07-24 DIAGNOSIS — K219 Gastro-esophageal reflux disease without esophagitis: Secondary | ICD-10-CM | POA: Insufficient documentation

## 2015-07-24 DIAGNOSIS — E669 Obesity, unspecified: Secondary | ICD-10-CM | POA: Insufficient documentation

## 2015-07-24 DIAGNOSIS — G2581 Restless legs syndrome: Secondary | ICD-10-CM | POA: Insufficient documentation

## 2015-07-24 DIAGNOSIS — M129 Arthropathy, unspecified: Secondary | ICD-10-CM

## 2015-07-24 DIAGNOSIS — I499 Cardiac arrhythmia, unspecified: Secondary | ICD-10-CM

## 2015-07-24 DIAGNOSIS — R112 Nausea with vomiting, unspecified: Secondary | ICD-10-CM | POA: Diagnosis not present

## 2015-07-24 DIAGNOSIS — R5383 Other fatigue: Secondary | ICD-10-CM

## 2015-07-24 MED ORDER — RIZATRIPTAN BENZOATE 10 MG PO TBDP: ORAL_TABLET | ORAL | Status: DC

## 2015-07-24 MED ORDER — SODIUM CHLORIDE 0.9 % IV SOLN
500.0000 mg | Freq: Once | INTRAVENOUS | Status: AC
  Administered 2015-07-24: 500 mg via INTRAVENOUS
  Filled 2015-07-24: qty 25

## 2015-07-24 NOTE — Progress Notes (Signed)
Patient is here for follow-up of IDA and IV Venofer. Patient states that his energy level has been very low lately. He states that it is partly his IDA and partly his work scheduled .

## 2015-07-24 NOTE — Telephone Encounter (Signed)
E-fax came through for refill on: Rx: Rizatrip

## 2015-07-25 ENCOUNTER — Encounter: Payer: Self-pay | Admitting: *Deleted

## 2015-07-25 ENCOUNTER — Encounter
Admission: EM | Disposition: A | Discharge status: Home / Self Care | Payer: Self-pay | Source: Ambulatory Visit | Attending: Surgery

## 2015-07-25 ENCOUNTER — Ambulatory Visit: Payer: PRIVATE HEALTH INSURANCE

## 2015-07-25 ENCOUNTER — Ambulatory Visit: Payer: PRIVATE HEALTH INSURANCE | Admitting: Certified Registered Nurse Anesthetist

## 2015-07-25 ENCOUNTER — Observation Stay
Admission: EM | Admit: 2015-07-25 | Discharge: 2015-07-26 | Disposition: A | Discharge status: Home / Self Care | Payer: PRIVATE HEALTH INSURANCE | Source: Ambulatory Visit | Attending: Surgery | Admitting: Surgery

## 2015-07-25 DIAGNOSIS — K828 Other specified diseases of gallbladder: Secondary | ICD-10-CM | POA: Diagnosis not present

## 2015-07-25 DIAGNOSIS — K8010 Calculus of gallbladder with chronic cholecystitis without obstruction: Secondary | ICD-10-CM | POA: Diagnosis present

## 2015-07-25 DIAGNOSIS — K819 Cholecystitis, unspecified: Secondary | ICD-10-CM | POA: Diagnosis not present

## 2015-07-25 DIAGNOSIS — J45909 Unspecified asthma, uncomplicated: Secondary | ICD-10-CM | POA: Insufficient documentation

## 2015-07-25 DIAGNOSIS — Z8249 Family history of ischemic heart disease and other diseases of the circulatory system: Secondary | ICD-10-CM | POA: Diagnosis not present

## 2015-07-25 DIAGNOSIS — K219 Gastro-esophageal reflux disease without esophagitis: Secondary | ICD-10-CM | POA: Diagnosis not present

## 2015-07-25 DIAGNOSIS — Z6841 Body Mass Index (BMI) 40.0 and over, adult: Secondary | ICD-10-CM | POA: Insufficient documentation

## 2015-07-25 DIAGNOSIS — Z8262 Family history of osteoporosis: Secondary | ICD-10-CM | POA: Insufficient documentation

## 2015-07-25 DIAGNOSIS — I10 Essential (primary) hypertension: Secondary | ICD-10-CM | POA: Diagnosis not present

## 2015-07-25 DIAGNOSIS — M25522 Pain in left elbow: Secondary | ICD-10-CM | POA: Insufficient documentation

## 2015-07-25 DIAGNOSIS — E669 Obesity, unspecified: Secondary | ICD-10-CM | POA: Insufficient documentation

## 2015-07-25 DIAGNOSIS — R51 Headache: Secondary | ICD-10-CM | POA: Diagnosis not present

## 2015-07-25 DIAGNOSIS — F419 Anxiety disorder, unspecified: Secondary | ICD-10-CM | POA: Insufficient documentation

## 2015-07-25 DIAGNOSIS — G2581 Restless legs syndrome: Secondary | ICD-10-CM | POA: Diagnosis not present

## 2015-07-25 DIAGNOSIS — Z880 Allergy status to penicillin: Secondary | ICD-10-CM | POA: Diagnosis not present

## 2015-07-25 DIAGNOSIS — D649 Anemia, unspecified: Secondary | ICD-10-CM | POA: Diagnosis not present

## 2015-07-25 DIAGNOSIS — G4730 Sleep apnea, unspecified: Secondary | ICD-10-CM | POA: Diagnosis not present

## 2015-07-25 HISTORY — PX: CHOLECYSTECTOMY: SHX55

## 2015-07-25 LAB — POTASSIUM
POTASSIUM: 4.1 mmol/L (ref 3.5–5.1)
Potassium: 3.8 mmol/L

## 2015-07-25 SURGERY — LAPAROSCOPIC CHOLECYSTECTOMY WITH INTRAOPERATIVE CHOLANGIOGRAM
Anesthesia: General

## 2015-07-25 MED ORDER — FENTANYL CITRATE (PF) 100 MCG/2ML IJ SOLN
25.0000 ug | INTRAMUSCULAR | Status: DC | PRN
  Administered 2015-07-25 (×2): 25 ug via INTRAVENOUS

## 2015-07-25 MED ORDER — MIDAZOLAM HCL 2 MG/2ML IJ SOLN
INTRAMUSCULAR | Status: DC | PRN
  Administered 2015-07-25: 2 mg via INTRAVENOUS

## 2015-07-25 MED ORDER — FENTANYL CITRATE (PF) 100 MCG/2ML IJ SOLN
INTRAMUSCULAR | Status: AC
  Administered 2015-07-25: 25 ug via INTRAVENOUS
  Filled 2015-07-25: qty 2

## 2015-07-25 MED ORDER — BUPIVACAINE-EPINEPHRINE (PF) 0.25% -1:200000 IJ SOLN
INTRAMUSCULAR | Status: DC | PRN
  Administered 2015-07-25: 30 mL

## 2015-07-25 MED ORDER — PHENYLEPHRINE HCL 10 MG/ML IJ SOLN
INTRAMUSCULAR | Status: DC | PRN
  Administered 2015-07-25: 100 ug via INTRAVENOUS

## 2015-07-25 MED ORDER — HEPARIN SODIUM (PORCINE) 5000 UNIT/ML IJ SOLN
5000.0000 [IU] | Freq: Once | INTRAMUSCULAR | Status: AC
  Administered 2015-07-25: 5000 [IU] via SUBCUTANEOUS

## 2015-07-25 MED ORDER — CEFAZOLIN SODIUM-DEXTROSE 2-3 GM-% IV SOLR
2.0000 g | INTRAVENOUS | Status: AC
  Administered 2015-07-25: 2 g via INTRAVENOUS

## 2015-07-25 MED ORDER — ALBUTEROL SULFATE HFA 108 (90 BASE) MCG/ACT IN AERS
INHALATION_SPRAY | RESPIRATORY_TRACT | Status: DC | PRN
  Administered 2015-07-25: 6 via RESPIRATORY_TRACT

## 2015-07-25 MED ORDER — DEXAMETHASONE SODIUM PHOSPHATE 4 MG/ML IJ SOLN
INTRAMUSCULAR | Status: DC | PRN
  Administered 2015-07-25: 10 mg via INTRAVENOUS

## 2015-07-25 MED ORDER — ONDANSETRON HCL 4 MG/2ML IJ SOLN: 4.0000 mg | Freq: Once | INTRAMUSCULAR | Status: DC | PRN

## 2015-07-25 MED ORDER — BUPIVACAINE-EPINEPHRINE (PF) 0.25% -1:200000 IJ SOLN
INTRAMUSCULAR | Status: AC
  Filled 2015-07-25: qty 30

## 2015-07-25 MED ORDER — ROCURONIUM BROMIDE 100 MG/10ML IV SOLN
INTRAVENOUS | Status: DC | PRN
  Administered 2015-07-25 (×2): 10 mg via INTRAVENOUS
  Administered 2015-07-25: 30 mg via INTRAVENOUS

## 2015-07-25 MED ORDER — GLYCOPYRROLATE 0.2 MG/ML IJ SOLN
INTRAMUSCULAR | Status: DC | PRN
  Administered 2015-07-25: .8 mg via INTRAVENOUS

## 2015-07-25 MED ORDER — PROPOFOL 10 MG/ML IV BOLUS
INTRAVENOUS | Status: DC | PRN
  Administered 2015-07-25: 160 mg via INTRAVENOUS
  Administered 2015-07-25: 40 mg via INTRAVENOUS
  Administered 2015-07-25: 50 mg via INTRAVENOUS

## 2015-07-25 MED ORDER — HEPARIN SODIUM (PORCINE) 5000 UNIT/ML IJ SOLN
5000.0000 [IU] | Freq: Three times a day (TID) | INTRAMUSCULAR | Status: DC
  Administered 2015-07-25 – 2015-07-26 (×3): 5000 [IU] via SUBCUTANEOUS
  Filled 2015-07-25 (×3): qty 1

## 2015-07-25 MED ORDER — ONDANSETRON HCL 4 MG PO TABS: 4.0000 mg | ORAL_TABLET | Freq: Four times a day (QID) | ORAL | Status: DC | PRN

## 2015-07-25 MED ORDER — LACTATED RINGERS IV SOLN
INTRAVENOUS | Status: DC
  Administered 2015-07-25: 10:00:00 via INTRAVENOUS

## 2015-07-25 MED ORDER — NEOSTIGMINE METHYLSULFATE 10 MG/10ML IV SOLN
INTRAVENOUS | Status: DC | PRN
  Administered 2015-07-25: 5 mg via INTRAVENOUS

## 2015-07-25 MED ORDER — FENTANYL CITRATE (PF) 100 MCG/2ML IJ SOLN
INTRAMUSCULAR | Status: DC | PRN
  Administered 2015-07-25: 100 ug via INTRAVENOUS
  Administered 2015-07-25 (×3): 50 ug via INTRAVENOUS

## 2015-07-25 MED ORDER — HYDROCODONE-ACETAMINOPHEN 5-325 MG PO TABS
1.0000 | ORAL_TABLET | ORAL | Status: DC | PRN
  Administered 2015-07-25 – 2015-07-26 (×4): 2 via ORAL
  Filled 2015-07-25 (×4): qty 2

## 2015-07-25 MED ORDER — CHLORHEXIDINE GLUCONATE 4 % EX LIQD: 1.0000 "application " | Freq: Once | CUTANEOUS | Status: DC

## 2015-07-25 MED ORDER — ONDANSETRON HCL 4 MG/2ML IJ SOLN
INTRAMUSCULAR | Status: DC | PRN
  Administered 2015-07-25: 4 mg via INTRAVENOUS

## 2015-07-25 MED ORDER — MORPHINE SULFATE (PF) 2 MG/ML IV SOLN
2.0000 mg | INTRAVENOUS | Status: DC | PRN
  Administered 2015-07-25 (×2): 2 mg via INTRAVENOUS
  Filled 2015-07-25 (×2): qty 1

## 2015-07-25 MED ORDER — SUCCINYLCHOLINE CHLORIDE 20 MG/ML IJ SOLN
INTRAMUSCULAR | Status: DC | PRN
  Administered 2015-07-25: 80 mg via INTRAVENOUS
  Administered 2015-07-25: 120 mg via INTRAVENOUS

## 2015-07-25 MED ORDER — IOTHALAMATE MEGLUMINE 60 % INJ SOLN
INTRAMUSCULAR | Status: DC | PRN
  Administered 2015-07-25: 10 mL

## 2015-07-25 MED ORDER — ONDANSETRON HCL 4 MG/2ML IJ SOLN: 4.0000 mg | Freq: Four times a day (QID) | INTRAMUSCULAR | Status: DC | PRN

## 2015-07-25 MED ORDER — LIDOCAINE HCL (CARDIAC) 20 MG/ML IV SOLN
INTRAVENOUS | Status: DC | PRN
  Administered 2015-07-25: 60 mg via INTRAVENOUS

## 2015-07-25 SURGICAL SUPPLY — 45 items
ADHESIVE MASTISOL STRL (MISCELLANEOUS) ×2 IMPLANT
APPLIER CLIP ROT 10 11.4 M/L (STAPLE) ×4
BLADE SURG SZ11 CARB STEEL (BLADE) ×2 IMPLANT
CANISTER SUCT 1200ML W/VALVE (MISCELLANEOUS) ×2 IMPLANT
CATH CHOLANGI 4FR 420404F (CATHETERS) ×2 IMPLANT
CATH EMB LATEX FREE 4FRX80CM (CATHETERS) ×1
CATH EMB LF 4FRX80 (CATHETERS) ×1 IMPLANT
CHLORAPREP W/TINT 26ML (MISCELLANEOUS) ×2 IMPLANT
CLIP APPLIE ROT 10 11.4 M/L (STAPLE) ×2 IMPLANT
CONRAY 60ML FOR OR (MISCELLANEOUS) ×2 IMPLANT
DRAPE C-ARM XRAY 36X54 (DRAPES) ×2 IMPLANT
ENDOPOUCH RETRIEVER 10 (MISCELLANEOUS) ×2 IMPLANT
GAUZE SPONGE NON-WVN 2X2 STRL (MISCELLANEOUS) ×4 IMPLANT
GLOVE BIO SURGEON STRL SZ8 (GLOVE) ×6 IMPLANT
GOWN STRL REUS W/ TWL LRG LVL3 (GOWN DISPOSABLE) ×2 IMPLANT
GOWN STRL REUS W/TWL LRG LVL3 (GOWN DISPOSABLE) ×2
IRRIGATION STRYKERFLOW (MISCELLANEOUS) ×1 IMPLANT
IRRIGATOR STRYKERFLOW (MISCELLANEOUS) ×2
IV CATH ANGIO 12GX3 LT BLUE (NEEDLE) ×2 IMPLANT
IV NS 1000ML (IV SOLUTION) ×1
IV NS 1000ML BAXH (IV SOLUTION) ×1 IMPLANT
JACKSON PRATT 10 (INSTRUMENTS) ×4 IMPLANT
KIT RM TURNOVER STRD PROC AR (KITS) ×2 IMPLANT
LABEL OR SOLS (LABEL) IMPLANT
NDL SAFETY 22GX1.5 (NEEDLE) ×2 IMPLANT
NEEDLE VERESS 14GA 120MM (NEEDLE) ×2 IMPLANT
NS IRRIG 500ML POUR BTL (IV SOLUTION) ×2 IMPLANT
PACK LAP CHOLECYSTECTOMY (MISCELLANEOUS) ×2 IMPLANT
PAD GROUND ADULT SPLIT (MISCELLANEOUS) ×2 IMPLANT
SCISSORS METZENBAUM CVD 33 (INSTRUMENTS) ×2 IMPLANT
SLEEVE ENDOPATH XCEL 5M (ENDOMECHANICALS) ×2 IMPLANT
SPONGE EXCIL AMD DRAIN 4X4 6P (MISCELLANEOUS) ×2 IMPLANT
SPONGE LAP 18X18 5 PK (GAUZE/BANDAGES/DRESSINGS) ×2 IMPLANT
SPONGE VERSALON 2X2 STRL (MISCELLANEOUS) ×4
STRIP CLOSURE SKIN 1/2X4 (GAUZE/BANDAGES/DRESSINGS) ×2 IMPLANT
SUT ETHILON 3-0 FS-10 30 BLK (SUTURE) ×2
SUT MNCRL 4-0 (SUTURE) ×1
SUT MNCRL 4-0 27XMFL (SUTURE) ×1
SUT VICRYL 0 AB UR-6 (SUTURE) ×2 IMPLANT
SUTURE EHLN 3-0 FS-10 30 BLK (SUTURE) ×1 IMPLANT
SUTURE MNCRL 4-0 27XMF (SUTURE) ×1 IMPLANT
SYR 20CC LL (SYRINGE) ×2 IMPLANT
TROCAR XCEL NON-BLD 11X100MML (ENDOMECHANICALS) ×2 IMPLANT
TROCAR XCEL NON-BLD 5MMX100MML (ENDOMECHANICALS) ×4 IMPLANT
TUBING INSUFFLATOR HI FLOW (MISCELLANEOUS) ×2 IMPLANT

## 2015-07-25 NOTE — Op Note (Signed)
Laparoscopic Cholecystectomy  Pre-operative Diagnosis: Biliary dyskinesia  Post-operative Diagnosis: Same  Procedure: Laparoscopic cholecystectomy with C-arm fluoroscopic cholangiography  Surgeon: Jerrol Banana. Burt Knack, MD FACS  Anesthesia: Gen. with endotracheal tube  Assistant: None  Procedure Details  The patient was seen again in the Holding Room. The benefits, complications, treatment options, and expected outcomes were discussed with the patient. The risks of bleeding, infection, recurrence of symptoms, failure to resolve symptoms, bile duct damage, bile duct leak, retained common bile duct stone, bowel injury, any of which could require further surgery and/or ERCP, stent, or papillotomy were reviewed with the patient. The likelihood of improving the patient's symptoms with return to their baseline status is good.  The patient and/or family concurred with the proposed plan, giving informed consent.  The patient was taken to Operating Room, identified as Marquee Fuchs. and the procedure verified as Laparoscopic Cholecystectomy.  A Time Out was held and the above information confirmed.  Prior to the induction of general anesthesia, antibiotic prophylaxis was administered. VTE prophylaxis was in place. General endotracheal anesthesia was then administered and tolerated well. After the induction, the abdomen was prepped with Chloraprep and draped in the sterile fashion. The patient was positioned in the supine position.  Local anesthetic  was injected into the skin near the umbilicus and an incision made. The Veress needle was placed. Pneumoperitoneum was then created with CO2 and tolerated well without any adverse changes in the patient's vital signs. A 98mm port was placed in the periumbilical position and the abdominal cavity was explored.  Two 5-mm ports were placed in the right upper quadrant and a 12 mm epigastric port was placed all under direct vision. All skin incisions  were  infiltrated with a local anesthetic agent before making the incision and placing the trocars.   The patient was positioned  in reverse Trendelenburg, tilted slightly to the patient's left.  The gallbladder was identified, the fundus grasped and retracted cephalad. Adhesions were lysed bluntly. The infundibulum was grasped and retracted laterally, exposing the peritoneum overlying the triangle of Calot. This was then divided and exposed in a blunt fashion. A critical view of the cystic duct and cystic artery was obtained.  The cystic duct was clearly identified and bluntly dissected.   There were multiple small branches of both venous and arterial structures around the gallbladder at the infundibulum these were clipped or doubly clipped and incised as well to allow for good visualization of the cystic duct as it entered the gallbladder. Bladder side of the cystic duct was clipped it was incised and through a separate incision and Angiocath a cholangiocatheter was placed there was an immediately noted therefore the catheter was removed and milking of the cystic duct retrograde demonstrated multiple tiny black stones another attempt at cholangiography was performed in a similar fashion but a leak was identified again. Through the Angiocath was placed and #4 embolectomy catheter into the cystic duct it could not be passed any further than the cholangiogram catheter had been passed suggesting cystic duct obstruction multiple small fragments of stones were milked back out of the cystic duct again and ultimately a final attempt at cholangiography was performed. C-arm fluoroscopic angiography demonstrated good flow into the duodenum the cystic duct itself had a very tiny lumen distal to the cholangio-gram catheter and it was quite tortuous the proximal ducts were well identified there were no intraluminal filling defects. The cholangiocatheter was then removed the cystic duct was doubly clipped and divided multiple  branches  of cystic artery and cystic veins were doubly clipped and divided and the gallbladder taken from the gallbladder fossa with electrocautery and passed up to the epigastric port site with the aid of an Endo Catch bag.   The liver bed was irrigated and inspected. Hemostasis was achieved with the electrocautery. Copious irrigation was utilized and was repeatedly aspirated until clear.  The gallbladder and Endocatch sac were then removed through the epigastric port site.   A 10 mm JP drain was brought in through the right lateral port site and placed into the foramen of Winslow and held in with a 3-0 nylon.  Inspection of the right upper quadrant was performed. No bleeding, bile duct injury or leak, or bowel injury was noted. Pneumoperitoneum was released.  The epigastric port site was closed with figure-of-eight 0 Vicryl sutures. 4-0 subcuticular Monocryl was used to close the skin. Steristrips and Mastisol and sterile dressings were  applied.  The patient was then extubated and brought to the recovery room in stable condition. Sponge, lap, and needle counts were correct at closure and at the conclusion of the case.   Findings: Chronic Cholecystitis   Estimated Blood Loss: 30 cc         Drains: One JP 10 mm         Specimens: Gallbladder           Complications: none               Bryler Dibble E. Burt Knack, MD, FACS

## 2015-07-25 NOTE — Transfer of Care (Signed)
Immediate Anesthesia Transfer of Care Note  Patient: Tanner Ryan.  Procedure(s) Performed: Procedure(s): LAPAROSCOPIC CHOLECYSTECTOMY WITH INTRAOPERATIVE CHOLANGIOGRAM (N/A)  Patient Location: PACU  Anesthesia Type:General  Level of Consciousness: awake, alert  and oriented  Airway & Oxygen Therapy: Patient Spontanous Breathing and Patient connected to face mask oxygen  Post-op Assessment: Report given to RN and Post -op Vital signs reviewed and stable  Post vital signs: Reviewed and stable  Last Vitals:  Filed Vitals:   07/25/15 1215  BP: 181/107  Pulse: 87  Temp: 36.8 C  Resp: 19    Complications: No apparent anesthesia complications

## 2015-07-25 NOTE — Progress Notes (Signed)
Preoperative Review   Patient is met in the preoperative holding area. The history is reviewed in the chart and with the patient. I personally reviewed the options and rationale as well as the risks of this procedure that have been previously discussed with the patient. All questions asked by the patient and/or family were answered to their satisfaction.  Patient agrees to proceed with this procedure at this time.  Ericha Whittingham E Tammey Deeg M.D. FACS  

## 2015-07-25 NOTE — Anesthesia Preprocedure Evaluation (Signed)
Anesthesia Evaluation   Patient awake    Reviewed: Allergy & Precautions, NPO status , Patient's Chart, lab work & pertinent test results, reviewed documented beta blocker date and time   Airway Mallampati: III  TM Distance: <3 FB     Dental  (+) Caps, Chipped   Pulmonary shortness of breath and with exertion, asthma , sleep apnea ,    Pulmonary exam normal       Cardiovascular hypertension, Pt. on medications Normal cardiovascular exam+ dysrhythmias     Neuro/Psych  Headaches, Anxiety    GI/Hepatic GERD-  ,  Endo/Other    Renal/GU      Musculoskeletal  (+) Arthritis -, Osteoarthritis,    Abdominal Normal abdominal exam  (+)   Peds  Hematology  (+) anemia ,   Anesthesia Other Findings   Reproductive/Obstetrics                             Anesthesia Physical Anesthesia Plan  ASA: II  Anesthesia Plan: General   Post-op Pain Management:    Induction: Intravenous  Airway Management Planned: Oral ETT  Additional Equipment:   Intra-op Plan:   Post-operative Plan: Extubation in OR  Informed Consent: I have reviewed the patients History and Physical, chart, labs and discussed the procedure including the risks, benefits and alternatives for the proposed anesthesia with the patient or authorized representative who has indicated his/her understanding and acceptance.   Dental advisory given  Plan Discussed with: CRNA and Surgeon  Anesthesia Plan Comments:         Anesthesia Quick Evaluation

## 2015-07-25 NOTE — Progress Notes (Signed)
Spoke with Dr. Pat Patrick regarding advancing diet.  Orders received.

## 2015-07-25 NOTE — Anesthesia Postprocedure Evaluation (Signed)
  Anesthesia Post-op Note  Patient: Tanner Ryan.  Procedure(s) Performed: Procedure(s): LAPAROSCOPIC CHOLECYSTECTOMY WITH INTRAOPERATIVE CHOLANGIOGRAM (N/A)  Anesthesia type:General  Patient location: PACU  Post pain: Pain level controlled  Post assessment: Post-op Vital signs reviewed, Patient's Cardiovascular Status Stable, Respiratory Function Stable, Patent Airway and No signs of Nausea or vomiting  Post vital signs: Reviewed and stable  Last Vitals:  Filed Vitals:   07/25/15 1409  BP: 126/81  Pulse: 77  Temp: 36.8 C  Resp: 16    Level of consciousness: awake, alert  and patient cooperative  Complications: No apparent anesthesia complications   Patient made aware of difficult intubation requiring the use of a glidescope

## 2015-07-25 NOTE — Anesthesia Procedure Notes (Signed)
Procedure Name: Intubation Performed by: Demetrius Charity Pre-anesthesia Checklist: Patient identified, Emergency Drugs available, Suction available, Patient being monitored and Timeout performed Patient Re-evaluated:Patient Re-evaluated prior to inductionOxygen Delivery Method: Circle system utilized Preoxygenation: Pre-oxygenation with 100% oxygen Intubation Type: IV induction Ventilation: Mask ventilation without difficulty Laryngoscope Size: Miller and 2 Grade View: Grade III Tube type: Oral Tube size: 7.5 mm Number of attempts: 3 Airway Equipment and Method: Stylet,  Video-laryngoscopy,  Bougie stylet and Rigid stylet Placement Confirmation: ETT inserted through vocal cords under direct vision,  CO2 detector,  positive ETCO2 and breath sounds checked- equal and bilateral Secured at: 23 cm Tube secured with: Tape Dental Injury: Teeth and Oropharynx as per pre-operative assessment  Difficulty Due To: Difficulty was anticipated, Difficult Airway- due to anterior larynx, Difficult Airway- due to limited oral opening and Difficult Airway- due to reduced neck mobility Future Recommendations: Recommend- induction with short-acting agent, and alternative techniques readily available and Recommend- awake intubation

## 2015-07-26 ENCOUNTER — Encounter: Payer: Self-pay | Admitting: Surgery

## 2015-07-26 DIAGNOSIS — K8010 Calculus of gallbladder with chronic cholecystitis without obstruction: Secondary | ICD-10-CM | POA: Diagnosis not present

## 2015-07-26 LAB — COMPREHENSIVE METABOLIC PANEL
ALK PHOS: 49 U/L (ref 38–126)
ALT: 28 U/L (ref 17–63)
ANION GAP: 5 (ref 5–15)
AST: 36 U/L (ref 15–41)
Albumin: 3.6 g/dL (ref 3.5–5.0)
BILIRUBIN TOTAL: 0.7 mg/dL (ref 0.3–1.2)
BUN: 10 mg/dL (ref 6–20)
CALCIUM: 8.8 mg/dL — AB (ref 8.9–10.3)
CO2: 28 mmol/L (ref 22–32)
Chloride: 106 mmol/L (ref 101–111)
Creatinine, Ser: 0.95 mg/dL (ref 0.61–1.24)
GFR calc non Af Amer: >60 mL/min (ref >60)
Glucose, Bld: 123 mg/dL — ABNORMAL HIGH (ref 65–99)
Potassium: 4.3 mmol/L (ref 3.5–5.1)
SODIUM: 139 mmol/L (ref 135–145)
TOTAL PROTEIN: 6.5 g/dL (ref 6.5–8.1)

## 2015-07-26 LAB — CBC
HCT: 40.7 % (ref 40.0–52.0)
HEMOGLOBIN: 13.2 g/dL (ref 13.0–18.0)
MCH: 28.5 pg (ref 26.0–34.0)
MCHC: 32.5 g/dL (ref 32.0–36.0)
MCV: 87.7 fL (ref 80.0–100.0)
Platelets: 206 10*3/uL (ref 150–440)
RBC: 4.65 MIL/uL (ref 4.40–5.90)
RDW: 15.3 % — ABNORMAL HIGH (ref 11.5–14.5)
WBC: 10.3 10*3/uL (ref 3.8–10.6)

## 2015-07-26 MED ORDER — HYDROCODONE-ACETAMINOPHEN 5-325 MG PO TABS: 1.0000 | ORAL_TABLET | ORAL | Status: DC | PRN

## 2015-07-26 NOTE — Discharge Instructions (Addendum)
Remove dressing in 24 hours. May shower in 24 hours. Leave paper strips in place. Resume all home medications. Follow-up with Dr. Burt Knack in 10 days.  Laparoscopic Cholecystectomy, Care After Refer to this sheet in the next few weeks. These instructions provide you with information on caring for yourself after your procedure. Your health care provider may also give you more specific instructions. Your treatment has been planned according to current medical practices, but problems sometimes occur. Call your health care provider if you have any problems or questions after your procedure. WHAT TO EXPECT AFTER THE PROCEDURE After your procedure, it is typical to have the following:  Pain at your incision sites. You will be given pain medicines to control the pain.  Mild nausea or vomiting. This should improve after the first 24 hours.  Bloating and possibly shoulder pain from the gas used during the procedure. This will improve after the first 24 hours. HOME CARE INSTRUCTIONS   Change bandages (dressings) as directed by your health care provider.  Keep the wound dry and clean. You may wash the wound gently with soap and water. Gently blot or dab the area dry.  Do not take baths or use swimming pools or hot tubs for 2 weeks or until your health care provider approves.  Only take over-the-counter or prescription medicines as directed by your health care provider.  Continue your normal diet as directed by your health care provider.  Do not lift anything heavier than 10 pounds (4.5 kg) until your health care provider approves.  Do not play contact sports for 1 week or until your health care provider approves. SEEK MEDICAL CARE IF:   You have redness, swelling, or increasing pain in the wound.  You notice yellowish-white fluid (pus) coming from the wound.  You have drainage from the wound that lasts longer than 1 day.  You notice a bad smell coming from the wound or dressing.  Your  surgical cuts (incisions) break open. SEEK IMMEDIATE MEDICAL CARE IF:   You develop a rash.  You have difficulty breathing.  You have chest pain.  You have a fever.  You have increasing pain in the shoulders (shoulder strap areas).  You have dizzy episodes or faint while standing.  You have severe abdominal pain.  You feel sick to your stomach (nauseous) or throw up (vomit) and this lasts for more than 1 day. Document Released: 11/18/2005 Document Revised: 09/08/2013 Document Reviewed: 06/30/2013 [facility] Patient Information 2015 [place], [place]. This information is not intended to replace advice given to you by your health care provider. Make sure you discuss any questions you have with your health care provider.

## 2015-07-26 NOTE — Progress Notes (Signed)
A&O. VSS. Tolerating regular diet well. Medicated for pain x2. Relief noted. Dressings dry and intact. JP drain removed per Dr. Burt Knack. Discharged per MD orders. Discharge intructions reviewed with pt and pt verbalized understanding. IV removed per policy. Prescription given to pt. Discharged ambulatory escorted by mother.

## 2015-07-27 LAB — SURGICAL PATHOLOGY

## 2015-08-02 ENCOUNTER — Other Ambulatory Visit: Payer: Self-pay | Admitting: Surgery

## 2015-08-04 ENCOUNTER — Encounter: Payer: Self-pay | Admitting: Surgery

## 2015-08-04 ENCOUNTER — Ambulatory Visit (INDEPENDENT_AMBULATORY_CARE_PROVIDER_SITE_OTHER): Payer: PRIVATE HEALTH INSURANCE | Admitting: Surgery

## 2015-08-04 VITALS — BP 122/89 | HR 90 | Temp 98.5°F | Ht 68.0 in | Wt 258.0 lb

## 2015-08-04 DIAGNOSIS — K828 Other specified diseases of gallbladder: Secondary | ICD-10-CM

## 2015-08-04 NOTE — Progress Notes (Signed)
Outpatient postop visit  08/04/2015  Tanner Ryan. is an 44 y.o. male.    Procedure: Lap or scopic cholecystectomy  CC status post laparoscopic cholecystectomy for dyskinesia  HPI: Patient has no complaints at this time feels well no fevers or chills he does have some loose stools after eating fatty meals.  Medications reviewed.    Physical Exam:  BP 122/89 mmHg  Pulse 90  Temp(Src) 98.5 F (36.9 C) (Oral)  Ht 5\' 8"  (1.727 m)  Wt 258 lb (117.028 kg)  BMI 39.24 kg/m2    PE: Soft nontender abdomen minimal ecchymosis no erythema or drainage    Assessment/Plan:  Pathology reviewed chronic cholecystitis and gallstones present. Of note the patient's preoperative diagnosis of biliary dyskinesia but tiny gallstones were identified both at surgery and at pathology. Clearly the gallbladder was the problem in his condition. He feels better and is able to eat fatty meals now but he does have some loose stools with them.irecommended that he continue this plan and follow-up on an as-needed basis  Florene Glen, MD, FACS

## 2015-08-04 NOTE — Patient Instructions (Signed)
You are able to go back to work. Remember to not lift more than 15lbs. For another four weeks. Please call us if you have any questions or concerns.

## 2015-08-06 NOTE — Progress Notes (Signed)
[facility]  [phone] [phone]) [phone]     ID: Tanner Ryan. OB: Aug 15, 1971  MR#: [id number]  PXT#:[id number]  Patient Care Team: Arnetha Courser, MD as PCP - General (Family Medicine)  CHIEF COMPLAINT/DIAGNOSIS:  Iron deficiency Anemia  - received parenteral iron therapy with Venofer 1 g in July/Aug 2015. (labs on 06/16/14 reported Hb 9.1, MCV 76, WBC 4600, unremarkable differential, platelets 239, Cr 0.85, Ca 8.7, ferritin low at 6. On 12/17/13, Hb was 9.8, MCV  76, LFT unremarkable, albumin 4.5, ferritin low at 6).  09/21/14 - Hb 12.5, ferritin 7, iron saturation 9%, serum iron 37. Otherwise serum B12, folate, direct Coombs test unremarkable.  HISTORY OF PRESENT ILLNESS:  Patient returns for continued hematology followup for anemia management. Recent labs show recurrent iron deficiency but patient chose to come today to plan IV iron, states that he is planned for cholecystectomy. He has intermittent right upper quadrant discomfort, nausea and vomiting from gallbladder issues. Otherwise states that he is doing steady. He has mild fatigue on exertion only. No dyspnea, orthopnea, angina, palpitation or PND. No bleeding symptoms.   REVIEW OF SYSTEMS:   ROS As in HPI above. In addition, no fever, chills. No new headaches or focal weakness.  No sore throat, cough, shortness of breath, sputum, hemoptysis or chest pain. No dizziness or palpitation. No abdominal pain, constipation, diarrhea, dysuria or hematuria. No new paresthesias in extremities.   PAST MEDICAL HISTORY: Reviewed. Past Medical History  Diagnosis Date  . Asthma   . Hypertension   . Sleep apnea   . Chronic pain of left elbow   . Allergy   . Anemia iron deficiency  . Anxiety   . Obesity   . RLS (restless legs syndrome)   . Dysrhythmia   . Shortness of breath dyspnea   . GERD (gastroesophageal reflux disease)   . Headache   . Arthritis           Craniotomy 2003  May 2015 -  Colonoscopy reported non-bleeding external hemorrhoids, polypectomy x2 (tubular adenomas). EGD showed normal                            esophagus, single lesion in stomach, normal examined duodenum.  PAST SURGICAL HISTORY: Reviewed. Past Surgical History  Procedure Laterality Date  . Elbow fracture surgery  2007  . Tonsillectomy and adenoidectomy  1978  . Craniotomy  2003  . Colonoscopy with esophagogastroduodenoscopy (egd)  04/28/2014  . Cholecystectomy N/A 07/25/2015    Procedure: LAPAROSCOPIC CHOLECYSTECTOMY WITH INTRAOPERATIVE CHOLANGIOGRAM;  Surgeon: Florene Glen, MD;  Location: [facility] ORS;  Service: General;  Laterality: N/A;    FAMILY HISTORY: Reviewed. Family History  Problem Relation Age of Onset  . Osteoporosis Mother   . Heart attack Father     SOCIAL HISTORY: Reviewed. Social History  Substance Use Topics  . Smoking status: Never Smoker   . Smokeless tobacco: Never Used  . Alcohol Use: Yes     Comment: Occasional    Allergies  Allergen Reactions  . Aspirin Other (See Comments)    GI issues  . Penicillins Nausea And Vomiting    Current Outpatient Prescriptions  Medication Sig Dispense Refill  . albuterol (PROVENTIL HFA;VENTOLIN HFA) 108 (90 BASE) MCG/ACT inhaler Inhale into the lungs every 6 (six) hours as needed for wheezing or shortness of breath.    Marland Kitchen buPROPion (WELLBUTRIN XL) 150 MG 24 hr tablet Take 150 mg by  mouth daily.    . busPIRone (BUSPAR) 15 MG tablet Take 15 mg by mouth 2 (two) times daily as needed.    . hydrochlorothiazide (HYDRODIURIL) 25 MG tablet Take 25 mg by mouth daily.    . metoprolol succinate (TOPROL-XL) 25 MG 24 hr tablet Take 25 mg by mouth daily.    . montelukast (SINGULAIR) 10 MG tablet Take 10 mg by mouth daily.    Marland Kitchen omeprazole (PRILOSEC) 20 MG capsule Take 20 mg by mouth daily.    . ondansetron (ZOFRAN-ODT) 4 MG disintegrating tablet Take 1 tablet (4 mg total) by mouth every 6 (six) hours as needed for nausea or vomiting. 15  tablet 1  . pramipexole (MIRAPEX) 0.5 MG tablet Take 0.5 mg by mouth at bedtime.    . traZODone (DESYREL) 50 MG tablet Take 1 tablet (50 mg total) by mouth at bedtime as needed for sleep. 30 tablet 3  . HYDROcodone-acetaminophen (NORCO/VICODIN) 5-325 MG per tablet Take 1-2 tablets by mouth every 4 (four) hours as needed for moderate pain. 30 tablet 0  . rizatriptan (MAXALT-MLT) 10 MG disintegrating tablet One under the tongue at onset of migraine; may repeat in 2 hours if needed 10 tablet 2   No current facility-administered medications for this visit.    PHYSICAL EXAM: Filed Vitals:   07/24/15 0902  BP: 129/87  Pulse: 87  Temp: 97.4 F (36.3 C)  Resp: 18     Body mass index is 39.76 kg/(m^2).     GENERAL: Patient is alert and oriented and in no acute distress. There is no icterus or pallor. HEENT: EOMs intact. No cervical lymphadenopathy. CVS: S1S2, regular LUNGS: Bilaterally clear to auscultation, no rhonchi. ABDOMEN: Soft, nontender. No hepatosplenomegaly clinically.  EXTREMITIES: No pedal edema.   LAB RESULTS: 07/18/15 - Hemoglobin 13.7 platelets 228 WBC 7.264% neutrophils creatinine 1.04, LFT unremarkable except AST of 44. 06/20/15 - serum iron 80, TIBC 494, iron saturation 16%, ferritin 9  Lab Results  Component Value Date   IRON 80 06/20/2015    STUDIES: 06/16/14 - Hb 9.1, MCV 76, WBC 4600, unremarkable differential, platelets 239, Cr 0.85, Ca 8.7, ferritin low at 6. On 12/17/13, Hb was 9.8, MCV 76, LFT unremarkable, albumin 4.5, ferritin low at 6.   ASSESSMENT / PLAN:   Iron deficiency Anemia   (labs on 06/16/14 reported Hb 9.1, MCV 76, WBC 4600, unremarkable differential, platelets 239, Cr 0.85, Ca 8.7, ferritin low at 6. On 12/17/13, Hb was 9.8, MCV 76, LFT unremarkable, albumin 4.5, ferritin low at 6). May 2015 Colonoscopy reported non-bleeding external hemorrhoids, polypectomy x2 (tubular adenomas). EGD showed normal esophagus, single lesion in stomach, normal  examined duodenum.  Received course of parenteral iron therapy with IV Venofer 1 g in July/Aug 2015  -    reviewed labs and d/w patient. Hemoglobin has been doing fairly well, however he has developed recurrent iron deficiency. Plan is to pursue another course of parenteral iron therapy with IV Venofer 500 mg over 4 hours 2 doses, patient is agreeable to this. He will receive first dose today, malignancy him back in 2 weeks and plan next treatment. He does not have any bleeding symptoms and has had colonoscopy last year. In between visits, patient advised to call or come to ER in case of any progressive anemia symptoms or sickness. He is agreeable to this plan.    Leia Alf, MD   08/06/2015 3:57 PM

## 2015-08-07 ENCOUNTER — Encounter: Payer: Self-pay | Admitting: Family Medicine

## 2015-08-07 ENCOUNTER — Other Ambulatory Visit: Payer: Self-pay | Admitting: Surgery

## 2015-08-08 ENCOUNTER — Inpatient Hospital Stay: Payer: PRIVATE HEALTH INSURANCE | Admitting: Internal Medicine

## 2015-08-08 ENCOUNTER — Inpatient Hospital Stay: Payer: PRIVATE HEALTH INSURANCE

## 2015-08-08 MED ORDER — METOPROLOL SUCCINATE ER 25 MG PO TB24: 25.0000 mg | ORAL_TABLET | Freq: Every day | ORAL | Status: DC

## 2015-08-08 MED ORDER — HYDROCHLOROTHIAZIDE 25 MG PO TABS: 25.0000 mg | ORAL_TABLET | Freq: Every day | ORAL | Status: DC

## 2015-08-08 NOTE — Telephone Encounter (Signed)
Routing to provider  

## 2015-08-09 ENCOUNTER — Telehealth: Payer: Self-pay

## 2015-08-09 NOTE — Telephone Encounter (Signed)
Received refill request from [place] for [employer]   Returned phone call to patient at this time. No answer. Left voicemail for return phone call.

## 2015-08-10 NOTE — Telephone Encounter (Signed)
Called patient once again today to see what is going on with pain. No answer. Unable to leave voicemail.  Would be glad to speak with patient if he calls back in.

## 2015-08-14 ENCOUNTER — Inpatient Hospital Stay (HOSPITAL_BASED_OUTPATIENT_CLINIC_OR_DEPARTMENT_OTHER): Payer: PRIVATE HEALTH INSURANCE | Admitting: Internal Medicine

## 2015-08-14 ENCOUNTER — Inpatient Hospital Stay: Payer: PRIVATE HEALTH INSURANCE | Attending: Internal Medicine

## 2015-08-14 ENCOUNTER — Inpatient Hospital Stay: Payer: PRIVATE HEALTH INSURANCE

## 2015-08-14 VITALS — BP 115/79 | HR 75 | Temp 95.5°F | Resp 18 | Ht 68.0 in | Wt 257.7 lb

## 2015-08-14 VITALS — BP 112/79 | HR 69

## 2015-08-14 DIAGNOSIS — G2581 Restless legs syndrome: Secondary | ICD-10-CM | POA: Diagnosis not present

## 2015-08-14 DIAGNOSIS — G4730 Sleep apnea, unspecified: Secondary | ICD-10-CM | POA: Insufficient documentation

## 2015-08-14 DIAGNOSIS — M129 Arthropathy, unspecified: Secondary | ICD-10-CM | POA: Diagnosis not present

## 2015-08-14 DIAGNOSIS — G8929 Other chronic pain: Secondary | ICD-10-CM | POA: Diagnosis not present

## 2015-08-14 DIAGNOSIS — J45909 Unspecified asthma, uncomplicated: Secondary | ICD-10-CM | POA: Diagnosis not present

## 2015-08-14 DIAGNOSIS — K219 Gastro-esophageal reflux disease without esophagitis: Secondary | ICD-10-CM | POA: Diagnosis not present

## 2015-08-14 DIAGNOSIS — D509 Iron deficiency anemia, unspecified: Secondary | ICD-10-CM

## 2015-08-14 DIAGNOSIS — I10 Essential (primary) hypertension: Secondary | ICD-10-CM | POA: Insufficient documentation

## 2015-08-14 DIAGNOSIS — E669 Obesity, unspecified: Secondary | ICD-10-CM | POA: Insufficient documentation

## 2015-08-14 DIAGNOSIS — Z79899 Other long term (current) drug therapy: Secondary | ICD-10-CM | POA: Insufficient documentation

## 2015-08-14 DIAGNOSIS — I499 Cardiac arrhythmia, unspecified: Secondary | ICD-10-CM | POA: Diagnosis not present

## 2015-08-14 DIAGNOSIS — R0602 Shortness of breath: Secondary | ICD-10-CM | POA: Diagnosis not present

## 2015-08-14 DIAGNOSIS — F419 Anxiety disorder, unspecified: Secondary | ICD-10-CM | POA: Diagnosis not present

## 2015-08-14 LAB — SAMPLE TO BLOOD BANK

## 2015-08-14 LAB — CBC
HCT: 41.9 % (ref 40.0–52.0)
Hemoglobin: 13.9 g/dL (ref 13.0–18.0)
MCH: 28.8 pg (ref 26.0–34.0)
MCHC: 33.1 g/dL (ref 32.0–36.0)
MCV: 87.1 fL (ref 80.0–100.0)
PLATELETS: 205 10*3/uL (ref 150–440)
RBC: 4.8 MIL/uL (ref 4.40–5.90)
RDW: 15.6 % — ABNORMAL HIGH (ref 11.5–14.5)
WBC: 5.5 10*3/uL (ref 3.8–10.6)

## 2015-08-14 MED ORDER — SODIUM CHLORIDE 0.9 % IV SOLN
500.0000 mg | Freq: Once | INTRAVENOUS | Status: AC
  Administered 2015-08-14: 500 mg via INTRAVENOUS
  Filled 2015-08-14: qty 25

## 2015-08-14 MED ORDER — SODIUM CHLORIDE 0.9 % IV SOLN
INTRAVENOUS | Status: DC
  Administered 2015-08-14: 11:00:00 via INTRAVENOUS
  Filled 2015-08-14: qty 1000

## 2015-08-14 NOTE — Progress Notes (Signed)
Patient is here for follow-up of IDA and IV Venofer treatment. Patient had gallbladder removed two weeks ago by Dr. Burt Knack. He states that his energy level is not great, but it is also not the worse it has been.

## 2015-08-30 NOTE — Progress Notes (Signed)
[place]  [phone] [phone]     ID: Tanner Ryan. OB: 04-Feb-1971  MR#: [id number]  TDV#:[id number]  Patient Care Team: Arnetha Courser, MD as PCP - General (Family Medicine)  CHIEF COMPLAINT/DIAGNOSIS:  Iron deficiency Anemia  - received parenteral iron therapy with Venofer 1 g in July/Aug 2015. (labs on 06/16/14 reported Hb 9.1, MCV 76, WBC 4600, unremarkable differential, platelets 239, Cr 0.85, Ca 8.7, ferritin low at 6. On 12/17/13, Hb was 9.8, MCV  76, LFT unremarkable, albumin 4.5, ferritin low at 6).  09/21/14 - Hb 12.5, ferritin 7, iron saturation 9%, serum iron 37. Otherwise serum B12, folate, direct Coombs test unremarkable.  HISTORY OF PRESENT ILLNESS:  Patient returns for continued hematology followup, he is status post cholecystectomy. States that he is doing well. Appetite is good. Denies bleeding symptoms. Denies any progressive fatigue on exertion, dyspnea, orthopnea, angina, palpitation or PND.   REVIEW OF SYSTEMS:   ROS As in HPI above. In addition, no fever, chills. No new headaches or focal weakness.  No new constipation, diarrhea, dysuria or hematuria. No new paresthesias in extremities.   PAST MEDICAL HISTORY: Reviewed. Past Medical History  Diagnosis Date  . Asthma   . Hypertension   . Sleep apnea   . Chronic pain of left elbow   . Allergy   . Anemia iron deficiency  . Anxiety   . Obesity   . RLS (restless legs syndrome)   . Dysrhythmia   . Shortness of breath dyspnea   . GERD (gastroesophageal reflux disease)   . Headache   . Arthritis           Craniotomy 2003  May 2015 - Colonoscopy reported non-bleeding external hemorrhoids, polypectomy x2 (tubular adenomas). EGD showed normal                            esophagus, single lesion in stomach, normal examined duodenum.  PAST SURGICAL HISTORY: Reviewed. Past Surgical History  Procedure Laterality Date  . Elbow fracture surgery  2007  . Tonsillectomy  and adenoidectomy  1978  . Craniotomy  2003  . Colonoscopy with esophagogastroduodenoscopy (egd)  04/28/2014  . Cholecystectomy N/A 07/25/2015    Procedure: LAPAROSCOPIC CHOLECYSTECTOMY WITH INTRAOPERATIVE CHOLANGIOGRAM;  Surgeon: Florene Glen, MD;  Location: [facility] ORS;  Service: General;  Laterality: N/A;    FAMILY HISTORY: Reviewed. Family History  Problem Relation Age of Onset  . Osteoporosis Mother   . Heart attack Father     SOCIAL HISTORY: Reviewed. Social History  Substance Use Topics  . Smoking status: Never Smoker   . Smokeless tobacco: Never Used  . Alcohol Use: Yes     Comment: Occasional    Allergies  Allergen Reactions  . Aspirin Other (See Comments)    GI issues  . Penicillins Nausea And Vomiting    Current Outpatient Prescriptions  Medication Sig Dispense Refill  . albuterol (PROVENTIL HFA;VENTOLIN HFA) 108 (90 BASE) MCG/ACT inhaler Inhale into the lungs every 6 (six) hours as needed for wheezing or shortness of breath.    Marland Kitchen buPROPion (WELLBUTRIN XL) 150 MG 24 hr tablet Take 150 mg by mouth daily.    . busPIRone (BUSPAR) 15 MG tablet Take 15 mg by mouth 2 (two) times daily as needed.    . hydrochlorothiazide (HYDRODIURIL) 25 MG tablet Take 1 tablet (25 mg total) by mouth daily. 30 tablet 6  . HYDROcodone-acetaminophen (NORCO/VICODIN) 5-325 MG per tablet  Take 1-2 tablets by mouth every 4 (four) hours as needed for moderate pain. 30 tablet 0  . metoprolol succinate (TOPROL-XL) 25 MG 24 hr tablet Take 1 tablet (25 mg total) by mouth daily. 30 tablet 6  . montelukast (SINGULAIR) 10 MG tablet Take 10 mg by mouth daily.    Marland Kitchen omeprazole (PRILOSEC) 20 MG capsule Take 20 mg by mouth daily.    . ondansetron (ZOFRAN-ODT) 4 MG disintegrating tablet Take 1 tablet (4 mg total) by mouth every 6 (six) hours as needed for nausea or vomiting. 15 tablet 1  . pramipexole (MIRAPEX) 0.5 MG tablet Take 0.5 mg by mouth at bedtime.    . rizatriptan (MAXALT-MLT) 10 MG disintegrating  tablet One under the tongue at onset of migraine; may repeat in 2 hours if needed 10 tablet 2  . traZODone (DESYREL) 50 MG tablet Take 1 tablet (50 mg total) by mouth at bedtime as needed for sleep. 30 tablet 3   No current facility-administered medications for this visit.    PHYSICAL EXAM: Filed Vitals:   08/14/15 0941  BP: 115/79  Pulse: 75  Temp: 95.5 F (35.3 C)  Resp: 18     Body mass index is 39.19 kg/(m^2).     GENERAL: Patient is alert and oriented and in no acute distress. There is no icterus or pallor. CVS: S1S2, regular LUNGS: Bilaterally clear to auscultation, no rhonchi. ABDOMEN: Soft, nontender.    EXTREMITIES: No pedal edema.   LAB RESULTS: 08/14/15 - hemoglobin 13.9, MCV 87.1, WBC 5.5, platelets 205. 07/18/15 - Hemoglobin 13.7 platelets 228 WBC 7.264% neutrophils creatinine 1.04, LFT unremarkable except AST of 44. 06/20/15 - serum iron 80, TIBC 494, iron saturation 16%, ferritin 9  Lab Results  Component Value Date   IRON 80 06/20/2015    STUDIES: 06/16/14 - Hb 9.1, MCV 76, WBC 4600, unremarkable differential, platelets 239, Cr 0.85, Ca 8.7, ferritin low at 6. On 12/17/13, Hb was 9.8, MCV 76, LFT unremarkable, albumin 4.5, ferritin low at 6.   ASSESSMENT / PLAN:   Iron deficiency Anemia   (labs on 06/16/14 reported Hb 9.1, MCV 76, WBC 4600, unremarkable differential, platelets 239, Cr 0.85, Ca 8.7, ferritin low at 6. On 12/17/13, Hb was 9.8, MCV 76, LFT unremarkable, albumin 4.5, ferritin low at 6). May 2015 Colonoscopy reported non-bleeding external hemorrhoids, polypectomy x2 (tubular adenomas). EGD showed normal esophagus, single lesion in stomach, normal examined duodenum.  Received course of parenteral iron therapy with IV Venofer 1 g in July/Aug 2015  -    reviewed labs and d/w patient. Hemoglobin continues to do well status post surgery, it is normal at 13.9 today. Patient does not need IV iron today. He does not have any bleeding symptoms and has had  colonoscopy last year. Plan is continued monitoring, we will see him back in about 4 months with repeat labs including iron studies and make further plan of management.  In between visits, patient advised to call or come to ER in case of any progressive anemia symptoms or sickness. He is agreeable to this plan.    Leia Alf, MD   08/30/2015 11:26 PM

## 2015-09-06 ENCOUNTER — Telehealth: Payer: Self-pay | Admitting: Family

## 2015-09-06 DIAGNOSIS — M5442 Lumbago with sciatica, left side: Secondary | ICD-10-CM

## 2015-09-06 MED ORDER — CYCLOBENZAPRINE HCL 10 MG PO TABS: 10.0000 mg | ORAL_TABLET | Freq: Three times a day (TID) | ORAL | Status: DC | PRN

## 2015-09-06 NOTE — Progress Notes (Signed)
We are sorry that you are not feeling well.  Here is how we plan to help!  Based on what you have shared with me it looks like you mostly have acute back pain.  Acute back pain is defined as musculoskeletal pain that can resolve in 1-3 weeks with conservative treatment.  I have prescribed Flexeril 10 mg every eight hours as needed which is a muscle relaxer.  I can prescribe an NSAID because it states you have an allergy to aspirin, so continue tylenol and your pain medication as prescribed. Some patients experience stomach irritation or in increased heartburn with anti-inflammatory drugs.  Please keep in mind that muscle relaxer's can cause fatigue and should not be taken while at work or driving.  Back pain is very common.  The pain often gets better over time.  The cause of back pain is usually not dangerous.  Most people can learn to manage their back pain on their own.  Home Care  Stay active.  Start with short walks on flat ground if you can.  Try to walk farther each day.  Do not sit, drive or stand in one place for more than 30 minutes.  Do not stay in bed.  Do not avoid exercise or work.  Activity can help your back heal faster.  Be careful when you bend or lift an object.  Bend at your knees, keep the object close to you, and do not twist.  Sleep on a firm mattress.  Lie on your side, and bend your knees.  If you lie on your back, put a pillow under your knees.  Only take medicines as told by your doctor.  Put ice on the injured area.  Put ice in a plastic bag  Place a towel between your skin and the bag  Leave the ice on for 15-20 minutes, 3-4 times a day for the first 2-3 days.  After that, you can switch between ice and heat packs.  Ask your doctor about back exercises or massage.  Avoid feeling anxious or stressed.  Find good ways to deal with stress, such as exercise.  Get Help Right Way If:  Your pain does not go away with rest or medicine.  Your pain does not go  away in 1 week.  You have new problems.  You do not feel well.  The pain spreads into your legs.  You cannot control when you poop (bowel movement) or pee (urinate)  You feel sick to your stomach (nauseous) or throw up (vomit)  You have belly (abdominal) pain.  You feel like you may pass out (faint).  If you develop a fever.  Make Sure you:  Understand these instructions.  Will watch your condition  Will get help right away if you are not doing well or get worse.  Your e-visit answers were reviewed by a board certified advanced clinical practitioner to complete your personal care plan.  Depending on the condition, your plan could have included both over the counter or prescription medications.  If there is a problem please reply  once you have received a response from your provider.  Your safety is important to [place].  If you have drug allergies check your prescription carefully.    You can use MyChart to ask questions about today's visit, request a non-urgent call back, or ask for a work or school excuse for 24 hours related to this e-Visit. If it has been greater than 24 hours you will need to follow  up with your provider, or enter a new e-Visit to address those concerns.  You will get an e-mail in the next two days asking about your experience.  I hope that your e-visit has been valuable and will speed your recovery. Thank you for using e-visits.

## 2015-09-08 ENCOUNTER — Encounter: Payer: Self-pay | Admitting: Family Medicine

## 2015-09-08 ENCOUNTER — Ambulatory Visit (INDEPENDENT_AMBULATORY_CARE_PROVIDER_SITE_OTHER): Payer: BLUE CROSS/BLUE SHIELD | Admitting: Family Medicine

## 2015-09-08 VITALS — BP 123/82 | HR 81 | Temp 98.6°F | Wt 265.0 lb

## 2015-09-08 DIAGNOSIS — M25561 Pain in right knee: Secondary | ICD-10-CM | POA: Diagnosis not present

## 2015-09-08 DIAGNOSIS — M25521 Pain in right elbow: Secondary | ICD-10-CM | POA: Diagnosis not present

## 2015-09-08 DIAGNOSIS — D509 Iron deficiency anemia, unspecified: Secondary | ICD-10-CM | POA: Diagnosis not present

## 2015-09-08 DIAGNOSIS — R635 Abnormal weight gain: Secondary | ICD-10-CM | POA: Diagnosis not present

## 2015-09-08 DIAGNOSIS — I10 Essential (primary) hypertension: Secondary | ICD-10-CM | POA: Diagnosis not present

## 2015-09-08 DIAGNOSIS — Z23 Encounter for immunization: Secondary | ICD-10-CM | POA: Diagnosis not present

## 2015-09-08 DIAGNOSIS — M25562 Pain in left knee: Secondary | ICD-10-CM

## 2015-09-08 DIAGNOSIS — M25522 Pain in left elbow: Secondary | ICD-10-CM | POA: Insufficient documentation

## 2015-09-08 MED ORDER — TRAMADOL HCL 50 MG PO TABS: 50.0000 mg | ORAL_TABLET | Freq: Four times a day (QID) | ORAL | Status: DC | PRN

## 2015-09-08 NOTE — Assessment & Plan Note (Signed)
Check CBC 

## 2015-09-08 NOTE — Patient Instructions (Addendum)
You received the flu shot today; it should protect you against the flu virus over the coming months; it will take about two weeks for antibodies to develop; do try to stay away from hospitals, nursing homes, and daycares during peak flu season; taking 1000 mg of vitamin C daily during flu season may help you avoid getting sick  The maximum dose of tylenol (acetaminophen) is 3,000 mg per day  Try the tramadol for pain; do not drive after taking it if it makes you feel loopy or goofy; do not drink any alcohol with this medicine  You can have the xrays done on Monday at [facility]

## 2015-09-08 NOTE — Progress Notes (Signed)
BP 123/82 mmHg  Pulse 81  Temp(Src) 98.6 F (37 C)  Wt 265 lb (120.203 kg)  SpO2 95%   Subjective:    Patient ID: Tanner Quan., male    DOB: September 30, 1971, 44 y.o.   MRN: [id number]  HPI: Tanner Ryan is a 44 y.o. male  Chief Complaint  Patient presents with  . Joint pain    knees and elbows bother him with damp/rainy weather   He has problems with knees and elbows; worse with damp weather; going on for years; he usually takes tylenol, but takes more than he should; taking 1800 mg a day Joints are stiff and they ache Worse first thing in the morning He has not had any blood testing, but plain xrays suggested inflammatory arthritis; maybe [facility] and years ago; I reviewed an xray of the left elbow dated 10/01/1998 but there is nothing under findings and impression Pain has been constant since then, 1999; s/p ORIF in the elbow joint, followed by staph infection and then surgery again to remove hardware The right elbow started to hurt about 3 years ago Overworking to compensate, right-handed The knees started to bother him, the last 90 days really bad  Knees pop, not crunching yet; no locking  First thing in the morning 6.5 out of 10, then eases down to a 3 or 4, then back up to a 5 or 6 out of 10 No redness or swelling No fevers; no rash; no known personal autoimmune disease Bad arthritis runs in both sides of the family, double whammy; no other autoimmune in the family Cannot take NSAIDs because tears up stomach Ongoing problem with iron-deficiency anemia; 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  Review of Systems Per HPI unless specifically indicated above     Objective:    BP  123/82 mmHg  Pulse 81  Temp(Src) 98.6 F (37 C)  Wt 265 lb (120.203 kg)  SpO2 95%  Wt Readings from Last 3 Encounters:  09/08/15 265 lb (120.203 kg)  08/14/15 257 lb 11.5 oz (116.9 kg)  08/04/15 258 lb (117.028 kg)    Physical Exam  Constitutional: He appears well-developed and well-nourished. No distress.  Weight gain of 8 pounds in less than 4 weeks  HENT:  Mouth/Throat: Mucous membranes are normal.  Cardiovascular: Normal rate.   Pulmonary/Chest: Effort normal and breath sounds normal.  Broad, barrel-chested  Abdominal: He exhibits no distension.  Musculoskeletal: He exhibits no edema.       Right elbow: He exhibits decreased range of motion and deformity. He exhibits no swelling and no effusion.       Left elbow: He exhibits decreased range of motion and deformity. He exhibits no swelling and no effusion.       Right knee: He exhibits decreased range of motion (limited with flexion; normal extension), abnormal alignment and abnormal patellar mobility (believe this runs laterally). He exhibits no effusion, no deformity, no LCL laxity and no MCL laxity. Tenderness found. Lateral joint line (moderate pain) and patellar tendon (superiorly) tenderness noted. No medial joint line, no MCL and no LCL tenderness noted.       Left knee: He exhibits decreased range of motion (limited with flexion, normal extension), abnormal  alignment and abnormal patellar mobility (believe this runs laterally). He exhibits no swelling, no effusion, no LCL laxity and no MCL laxity. No medial joint line and no lateral joint line tenderness noted.       Right hand: He exhibits no deformity (no MCP enlargement or ulnar deviation).       Left hand: He exhibits no deformity (no MCP enlargement or ulnar deviation).  Crepitus with flexion and extension of the knees, right >> left  Skin: Skin is warm, dry and intact. He is not diaphoretic. No pallor.  Psychiatric: He has a normal mood and affect. His behavior is  normal. Judgment and thought content normal.   Results for orders placed or performed in visit on 08/14/15  CBC  Result Value Ref Range   WBC 5.5 3.8 - 10.6 K/uL   RBC 4.80 4.40 - 5.90 MIL/uL   Hemoglobin 13.9 13.0 - 18.0 g/dL   HCT 41.9 40.0 - 52.0 %   MCV 87.1 80.0 - 100.0 fL   MCH 28.8 26.0 - 34.0 pg   MCHC 33.1 32.0 - 36.0 g/dL   RDW 15.6 (H) 11.5 - 14.5 %   Platelets 205 150 - 440 K/uL  Hold Tube- Blood Bank  Result Value Ref Range   Blood Bank Specimen SAMPLE AVAILABLE FOR TESTING    Sample Expiration 08/17/2015       Assessment & Plan:   Problem List Items Addressed This Visit      Cardiovascular and Mediastinum   Benign hypertension    Controlled today        Other   Microcytic hypochromic anemia    Resolved; followed by Dr. 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; we are avoiding NSAIDs because of his hx of gastritis and anemia; will start tramadol; may use with tylenol; xray of right elbow only; we opted to not xray the left elbow as it won't change our plan and he has seen ortho about that one already in the past      Relevant Orders   DG Elbow Complete Right   ANA w/Reflex if Positive   Uric acid   Rheumatoid Factor   Arthralgia of both knees - Primary   Relevant Orders   ANA w/Reflex if Positive   Uric acid   Rheumatoid Factor   DG Knee Complete 4 Views Left   DG Knee Complete 4 Views Right   Weight gain, abnormal    Weight gain of 8 pounds in less than one month      Relevant Orders   TSH    Other Visit Diagnoses    Needs flu shot        flu vaccine offered and given  today; discussed; still avoid hotbeds of flu        Follow up plan: No Follow-up on file.  Meds ordered this encounter  Medications  . traMADol (ULTRAM) 50 MG tablet    Sig: Take 1 tablet (50 mg total) by mouth every 6 (six) hours as needed.    Dispense:  60 tablet  Refill:  1    Orders Placed This Encounter  Procedures  . DG Elbow Complete Right  . DG Knee Complete 4 Views Left  . DG Knee Complete 4 Views Right  . Flu Vaccine QUAD 36+ mos IM  . ANA w/Reflex if Positive  . Uric acid  . TSH  . Rheumatoid Factor

## 2015-09-08 NOTE — Assessment & Plan Note (Signed)
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

## 2015-09-08 NOTE — Assessment & Plan Note (Signed)
Resolved; followed by Dr. Ma Hillock; RDW still up a little, so he's still recruiting some additional red blood cells, explained concept of RDW, but MCV and MCH look great; will avoid NSAIDs because we want to avoid gastritis

## 2015-09-08 NOTE — Assessment & Plan Note (Signed)
Controlled today 

## 2015-09-08 NOTE — Assessment & Plan Note (Signed)
Weight gain of 8 pounds in less than one month

## 2015-09-09 LAB — TSH: TSH: 1.39 u[IU]/mL (ref 0.450–4.500)

## 2015-09-09 LAB — ANA W/REFLEX IF POSITIVE: ANA: NEGATIVE

## 2015-09-09 LAB — URIC ACID: URIC ACID: 7.7 mg/dL (ref 3.7–8.6)

## 2015-09-09 LAB — RHEUMATOID FACTOR: Rhuematoid fact SerPl-aCnc: <10 IU/mL (ref 0.0–13.9)

## 2015-09-12 ENCOUNTER — Other Ambulatory Visit: Payer: Self-pay

## 2015-09-14 ENCOUNTER — Ambulatory Visit: Payer: PRIVATE HEALTH INSURANCE | Admitting: Family Medicine

## 2015-09-18 ENCOUNTER — Telehealth: Payer: Self-pay | Admitting: Family Medicine

## 2015-09-18 NOTE — Telephone Encounter (Signed)
I left msg, calling about labs

## 2015-09-26 NOTE — Telephone Encounter (Signed)
I left detailed msg; uric acid a little elevated, but I don't think that's the cause of symptoms; other labs all negative; call with questions

## 2015-11-01 ENCOUNTER — Encounter: Payer: Self-pay | Admitting: Family Medicine

## 2015-11-01 ENCOUNTER — Ambulatory Visit (INDEPENDENT_AMBULATORY_CARE_PROVIDER_SITE_OTHER): Payer: BLUE CROSS/BLUE SHIELD | Admitting: Family Medicine

## 2015-11-01 VITALS — BP 137/90 | HR 102 | Temp 98.2°F | Wt 265.0 lb

## 2015-11-01 DIAGNOSIS — J309 Allergic rhinitis, unspecified: Secondary | ICD-10-CM

## 2015-11-01 DIAGNOSIS — J45909 Unspecified asthma, uncomplicated: Secondary | ICD-10-CM | POA: Diagnosis not present

## 2015-11-01 DIAGNOSIS — Z8679 Personal history of other diseases of the circulatory system: Secondary | ICD-10-CM

## 2015-11-01 DIAGNOSIS — F411 Generalized anxiety disorder: Secondary | ICD-10-CM

## 2015-11-01 DIAGNOSIS — H6982 Other specified disorders of Eustachian tube, left ear: Secondary | ICD-10-CM

## 2015-11-01 DIAGNOSIS — R635 Abnormal weight gain: Secondary | ICD-10-CM | POA: Diagnosis not present

## 2015-11-01 DIAGNOSIS — I10 Essential (primary) hypertension: Secondary | ICD-10-CM | POA: Diagnosis not present

## 2015-11-01 DIAGNOSIS — J0100 Acute maxillary sinusitis, unspecified: Secondary | ICD-10-CM | POA: Diagnosis not present

## 2015-11-01 MED ORDER — BUSPIRONE HCL 15 MG PO TABS: 15.0000 mg | ORAL_TABLET | Freq: Two times a day (BID) | ORAL | Status: DC | PRN

## 2015-11-01 MED ORDER — DOXYCYCLINE HYCLATE 100 MG PO TABS: 100.0000 mg | ORAL_TABLET | Freq: Two times a day (BID) | ORAL | Status: DC

## 2015-11-01 MED ORDER — MONTELUKAST SODIUM 10 MG PO TABS: 10.0000 mg | ORAL_TABLET | Freq: Every day | ORAL | Status: DC

## 2015-11-01 NOTE — Patient Instructions (Addendum)
You can use over-the-counter Afrin and use for three days and three days only, FOUR is right OUT Spray - lay - roll technique Start the antibiotics Please do eat yogurt daily or take a probiotic daily for the next month or two We want to replace the healthy germs in the gut If you notice foul, watery diarrhea in the next two months, schedule an appointment RIGHT AWAY Monitor your blood pressure and call if not under 90 on the bottom Try the DASH guidelines

## 2015-11-01 NOTE — Progress Notes (Signed)
BP 137/90 mmHg  Pulse 102  Temp(Src) 98.2 F (36.8 C)  Wt 265 lb (120.203 kg)  SpO2 98%   Subjective:    Patient ID: Tanner Quan., male    DOB: September 03, 1971, 44 y.o.   MRN: [id number]  HPI: Tanner Ryan is a 44 y.o. male  Chief Complaint  Patient presents with  . Ear Pain    left ear x 2 weeks, pain and hearing loss   He has had pain and pressure in the left ear for about 2 weeks Can't hear hardly at all, not distinctly, muffled, like in a barrel Goes from the left side of cheek across No swollen glands in the neck A few little episodes of sore throat, just irritated when swallowing, not really bad No fevers No drainage from the left ear He tries to clean his ears out and gets some wax out No hx of ear wax removal before No travel; no rash He tried sudafed and that helps a little bit, but causes him to feel drowsy (verified not benadryl)  He has been taking his wellbutrin for about 10 days; anxiety is doing well He is using buspar Off of BP medicines completely (expect beta-blocker) He takes metoprolol and uses that for prevention of SVT Singulair really helps his asthma and allergies; can tell with missed doses  Relevant past medical, surgical, family and social history reviewed and updated as indicated. Interim medical history since our last visit reviewed. Allergies and medications reviewed and updated.  Review of Systems Per HPI unless specifically indicated above     Objective:    BP 137/90 mmHg  Pulse 102  Temp(Src) 98.2 F (36.8 C)  Wt 265 lb (120.203 kg)  SpO2 98%  Wt Readings from Last 3 Encounters:  11/01/15 265 lb (120.203 kg)  09/08/15 265 lb (120.203 kg)  08/14/15 257 lb 11.5 oz (116.9 kg)  body mass index is 40.3 kg/(m^2).  Physical Exam  Constitutional: He appears well-developed and well-nourished. He does not appear ill. No distress.  Weight stable since last visit; morbidly obese  HENT:  Nose: Mucosal edema and rhinorrhea  present. Left sinus exhibits maxillary sinus tenderness.  Mouth/Throat: Mucous membranes are normal. No oropharyngeal exudate, posterior oropharyngeal edema or posterior oropharyngeal erythema.  Eyes: Right eye exhibits no discharge. Left eye exhibits no discharge. Right conjunctiva is not injected. Left conjunctiva is not injected.  Cardiovascular: Normal rate.   Pulmonary/Chest: Effort normal and breath sounds normal.  Broad, barrel-chested  Abdominal: He exhibits no distension.  Musculoskeletal: He exhibits no edema.  Lymphadenopathy:    He has no cervical adenopathy.  Skin: Skin is warm, dry and intact. He is not diaphoretic. No pallor.  Psychiatric: He has a normal mood and affect. His behavior is normal. Judgment and thought content normal.   Results for orders placed or performed in visit on 09/08/15  ANA w/Reflex if Positive  Result Value Ref Range   Anit Nuclear Antibody(ANA) Negative Negative  Uric acid  Result Value Ref Range   Uric Acid 7.7 3.7 - 8.6 mg/dL  TSH  Result Value Ref Range   TSH 1.390 0.450 - 4.500 uIU/mL  Rheumatoid Factor  Result Value Ref Range   Rhuematoid fact SerPl-aCnc <10.0 0.0 - 13.9 IU/mL      Assessment & Plan:   Problem List Items Addressed This Visit      Cardiovascular and Mediastinum   Benign hypertension    Not controlled; explained diastolic not quite to goal;  monitor and contact me if not UNDER 90 mmHg; DASH guidelines; avoid decongestants; may use TOPICAL afrin but avoid the oral decongestants found in many sinus / cold medicines        Respiratory   Allergic rhinitis    Doing well with singulair, continue      Asthma    Doing well with singulair; continue      Relevant Medications   montelukast (SINGULAIR) 10 MG tablet     Other   Generalized anxiety disorder    He is doing well OFF of wellbutrin; will continue just the buspirone; refills provided      Morbidly obese (HCC)    Weight stabilized since last visit, but  now in morbidly obese category; tough time of year to work on weight loss      Weight gain, abnormal    His weight has stabilized since last visit, but he is unfortunately in the morbidly obese category now      History of PSVT (paroxysmal supraventricular tachycardia)    Continue beta-blocker; we discussed that this might cause some fatigue, but we won't decrease or discontinue since it's helping hold PSVT at [place]       Other Visit Diagnoses    Eustachian tube dysfunction, left    -  Primary    Afrin for three days, NO MORE; explained why; spray, lay, roll technique    Acute maxillary sinusitis, recurrence not specified        antibiotics; cautioned about risk of C diff; yogurt or probiotics, see AVS    Relevant Medications    doxycycline (VIBRA-TABS) 100 MG tablet       Follow up plan: Return if symptoms worsen or fail to improve.  An after-visit summary was printed and given to the patient at [facility].  Please see the patient instructions which may contain other information and recommendations beyond what is mentioned above in the assessment and plan.  Meds ordered this encounter  Medications  . doxycycline (VIBRA-TABS) 100 MG tablet    Sig: Take 1 tablet (100 mg total) by mouth 2 (two) times daily.    Dispense:  20 tablet    Refill:  0  . busPIRone (BUSPAR) 15 MG tablet    Sig: Take 1 tablet (15 mg total) by mouth 2 (two) times daily as needed.    Dispense:  60 tablet    Refill:  6  . montelukast (SINGULAIR) 10 MG tablet    Sig: Take 1 tablet (10 mg total) by mouth daily.    Dispense:  30 tablet    Refill:  11

## 2015-11-05 DIAGNOSIS — Z8679 Personal history of other diseases of the circulatory system: Secondary | ICD-10-CM | POA: Insufficient documentation

## 2015-11-05 NOTE — Assessment & Plan Note (Signed)
Continue beta-blocker; we discussed that this might cause some fatigue, but we won't decrease or discontinue since it's helping hold PSVT at bay

## 2015-11-05 NOTE — Assessment & Plan Note (Signed)
Not controlled; explained diastolic not quite to goal; monitor and contact me if not UNDER 90 mmHg; DASH guidelines; avoid decongestants; may use TOPICAL afrin but avoid the oral decongestants found in many sinus / cold medicines

## 2015-11-05 NOTE — Assessment & Plan Note (Signed)
He is doing well OFF of wellbutrin; will continue just the buspirone; refills provided

## 2015-11-05 NOTE — Assessment & Plan Note (Signed)
Weight stabilized since last visit, but now in morbidly obese category; tough time of year to work on weight loss

## 2015-11-05 NOTE — Assessment & Plan Note (Signed)
His weight has stabilized since last visit, but he is unfortunately in the morbidly obese category now

## 2015-11-05 NOTE — Assessment & Plan Note (Signed)
Doing well with singulair; continue

## 2015-11-05 NOTE — Assessment & Plan Note (Signed)
Doing well with singulair, continue

## 2015-11-22 ENCOUNTER — Ambulatory Visit: Payer: No Typology Code available for payment source | Admitting: Family Medicine

## 2015-11-28 ENCOUNTER — Other Ambulatory Visit: Payer: Self-pay | Admitting: Nurse Practitioner

## 2015-12-08 ENCOUNTER — Other Ambulatory Visit: Payer: Self-pay

## 2015-12-08 MED ORDER — RIZATRIPTAN BENZOATE 10 MG PO TBDP: ORAL_TABLET | ORAL | Status: DC

## 2015-12-08 NOTE — Telephone Encounter (Signed)
Routing to provider  

## 2015-12-19 ENCOUNTER — Ambulatory Visit: Payer: Self-pay | Admitting: Internal Medicine

## 2015-12-19 ENCOUNTER — Inpatient Hospital Stay: Payer: BLUE CROSS/BLUE SHIELD | Attending: Internal Medicine | Admitting: Internal Medicine

## 2015-12-19 ENCOUNTER — Inpatient Hospital Stay (HOSPITAL_BASED_OUTPATIENT_CLINIC_OR_DEPARTMENT_OTHER): Payer: BLUE CROSS/BLUE SHIELD

## 2015-12-19 VITALS — BP 143/93 | HR 84 | Temp 98.0°F | Wt 273.6 lb

## 2015-12-19 DIAGNOSIS — M129 Arthropathy, unspecified: Secondary | ICD-10-CM | POA: Diagnosis not present

## 2015-12-19 DIAGNOSIS — D509 Iron deficiency anemia, unspecified: Secondary | ICD-10-CM | POA: Insufficient documentation

## 2015-12-19 DIAGNOSIS — R0602 Shortness of breath: Secondary | ICD-10-CM

## 2015-12-19 DIAGNOSIS — K219 Gastro-esophageal reflux disease without esophagitis: Secondary | ICD-10-CM | POA: Diagnosis not present

## 2015-12-19 DIAGNOSIS — G4730 Sleep apnea, unspecified: Secondary | ICD-10-CM | POA: Diagnosis not present

## 2015-12-19 DIAGNOSIS — G2581 Restless legs syndrome: Secondary | ICD-10-CM | POA: Insufficient documentation

## 2015-12-19 DIAGNOSIS — G8929 Other chronic pain: Secondary | ICD-10-CM | POA: Diagnosis not present

## 2015-12-19 DIAGNOSIS — E669 Obesity, unspecified: Secondary | ICD-10-CM | POA: Diagnosis not present

## 2015-12-19 DIAGNOSIS — F419 Anxiety disorder, unspecified: Secondary | ICD-10-CM | POA: Insufficient documentation

## 2015-12-19 DIAGNOSIS — I10 Essential (primary) hypertension: Secondary | ICD-10-CM

## 2015-12-19 DIAGNOSIS — J45909 Unspecified asthma, uncomplicated: Secondary | ICD-10-CM | POA: Insufficient documentation

## 2015-12-19 LAB — CBC WITH DIFFERENTIAL/PLATELET
Basophils Absolute: 0 10*3/uL (ref 0–0.1)
Basophils Relative: 1 %
Eosinophils Absolute: 0.2 10*3/uL (ref 0–0.7)
Eosinophils Relative: 3 %
HEMATOCRIT: 45.1 % (ref 40.0–52.0)
Hemoglobin: 14.7 g/dL (ref 13.0–18.0)
LYMPHS ABS: 1.6 10*3/uL (ref 1.0–3.6)
LYMPHS PCT: 27 %
MCH: 29.7 pg (ref 26.0–34.0)
MCHC: 32.6 g/dL (ref 32.0–36.0)
MCV: 91 fL (ref 80.0–100.0)
MONO ABS: 0.6 10*3/uL (ref 0.2–1.0)
MONOS PCT: 10 %
NEUTROS ABS: 3.6 10*3/uL (ref 1.4–6.5)
Neutrophils Relative %: 59 %
PLATELETS: 193 10*3/uL (ref 150–440)
RBC: 4.96 MIL/uL (ref 4.40–5.90)
RDW: 14.3 % (ref 11.5–14.5)
WBC: 6 10*3/uL (ref 3.8–10.6)

## 2015-12-19 LAB — FERRITIN: Ferritin: 52 ng/mL (ref 24–336)

## 2015-12-19 NOTE — Progress Notes (Signed)
[place]  [phone] [phone]     ID: Tanner Ryan. OB: 01-Jul-1971  MR#: [id number]  [id number]  Patient Care Team: Arnetha Courser, MD as PCP - General (Family Medicine)  CHIEF COMPLAINT/DIAGNOSIS:  Iron deficiency Anemia  - received parenteral iron therapy with Venofer 1 g in July/Aug 2015. (labs on 06/16/14 reported Hb 9.1, MCV 76, WBC 4600, unremarkable differential, platelets 239, Cr 0.85, Ca 8.7, ferritin low at 6. On 12/17/13, Hb was 9.8, MCV  76, LFT unremarkable, albumin 4.5, ferritin low at 6).  09/21/14 - Hb 12.5, ferritin 7, iron saturation 9%, serum iron 37. Otherwise serum B12, folate, direct Coombs test unremarkable.  HISTORY OF PRESENT ILLNESS:  Tanner Ryan returns to our clinic for follow-up visit. He has done well since his previous appointment. He denies any bleeding from any source. He has been able to tolerate all activities of daily living with no significant difficulties. He maintains a regular diet. He takes ferrous sulfate every other day, since it causes constipation if taken on a daily basis.  REVIEW OF SYSTEMS:   ROS As in HPI above. In addition, no fever, chills. No new headaches or focal weakness.  No new constipation, diarrhea, dysuria or hematuria. No new paresthesias in extremities.   PAST MEDICAL HISTORY: Reviewed. Past Medical History  Diagnosis Date  . Asthma   . Hypertension   . Sleep apnea   . Chronic pain of left elbow   . Allergy   . Anemia iron deficiency  . Anxiety   . Obesity   . RLS (restless legs syndrome)   . Dysrhythmia   . Shortness of breath dyspnea   . GERD (gastroesophageal reflux disease)   . Headache   . Arthritis           Craniotomy 2003  May 2015 - Colonoscopy reported non-bleeding external hemorrhoids, polypectomy x2 (tubular adenomas). EGD showed normal                            esophagus, single lesion in stomach, normal examined duodenum.  PAST SURGICAL HISTORY:  Reviewed. Past Surgical History  Procedure Laterality Date  . Elbow fracture surgery  2007  . Tonsillectomy and adenoidectomy  1978  . Craniotomy  2003  . Colonoscopy with esophagogastroduodenoscopy (egd)  04/28/2014  . Cholecystectomy N/A 07/25/2015    Procedure: LAPAROSCOPIC CHOLECYSTECTOMY WITH INTRAOPERATIVE CHOLANGIOGRAM;  Surgeon: Florene Glen, MD;  Location: [facility] ORS;  Service: General;  Laterality: N/A;    FAMILY HISTORY: Reviewed. Family History  Problem Relation Age of Onset  . Osteoporosis Mother   . Heart attack Father     SOCIAL HISTORY: Reviewed. Social History  Substance Use Topics  . Smoking status: Never Smoker   . Smokeless tobacco: Never Used  . Alcohol Use: Yes     Comment: Occasional    Allergies  Allergen Reactions  . Aspirin Other (See Comments)    GI issues  . Penicillins Nausea And Vomiting    Current Outpatient Prescriptions  Medication Sig Dispense Refill  . albuterol (PROVENTIL HFA;VENTOLIN HFA) 108 (90 BASE) MCG/ACT inhaler Inhale into the lungs every 6 (six) hours as needed for wheezing or shortness of breath.    . busPIRone (BUSPAR) 15 MG tablet Take 1 tablet (15 mg total) by mouth 2 (two) times daily as needed. 60 tablet 6  . cyclobenzaprine (FLEXERIL) 10 MG tablet Take 1 tablet (10 mg total) by mouth 3 (  three) times daily as needed for muscle spasms. 30 tablet 0  . doxycycline (VIBRA-TABS) 100 MG tablet Take 1 tablet (100 mg total) by mouth 2 (two) times daily. 20 tablet 0  . hydrochlorothiazide (HYDRODIURIL) 25 MG tablet Take 1 tablet (25 mg total) by mouth daily. 30 tablet 6  . metoprolol succinate (TOPROL-XL) 25 MG 24 hr tablet Take 1 tablet (25 mg total) by mouth daily. 30 tablet 6  . montelukast (SINGULAIR) 10 MG tablet Take 1 tablet (10 mg total) by mouth daily. 30 tablet 11  . omeprazole (PRILOSEC) 20 MG capsule Take 20 mg by mouth daily.    . ondansetron (ZOFRAN-ODT) 4 MG disintegrating tablet Take 1 tablet (4 mg total) by mouth  every 6 (six) hours as needed for nausea or vomiting. 15 tablet 1  . pramipexole (MIRAPEX) 0.5 MG tablet Take 0.5 mg by mouth at bedtime.    . rizatriptan (MAXALT-MLT) 10 MG disintegrating tablet One under the tongue at onset of migraine; may repeat in 2 hours if needed 10 tablet 2  . traMADol (ULTRAM) 50 MG tablet Take 1 tablet (50 mg total) by mouth every 6 (six) hours as needed. 60 tablet 1  . traZODone (DESYREL) 50 MG tablet Take 1 tablet (50 mg total) by mouth at bedtime as needed for sleep. 30 tablet 3   No current facility-administered medications for this visit.    PHYSICAL EXAM: Filed Vitals:   12/19/15 1149  BP: 143/93  Pulse: 84  Temp: 98 F (36.7 C)     Body mass index is 41.61 kg/(m^2).    BP 143/93 mmHg  Pulse 84  Temp(Src) 98 F (36.7 C) (Oral)  Wt 273 lb 9.5 oz (124.1 kg)  General Appearance:    Alert, cooperative, no distress, appears stated age morbidly obese stocky Caucasian male   Head:    Normocephalic, without obvious abnormality, atraumatic  Eyes:    PERRL, conjunctiva/corneas clear, EOM's intact, fundi    benign, both eyes       Ears:    Normal TM's and external ear canals, both ears  Nose:   Nares normal, septum midline, mucosa normal, no drainage   or sinus tenderness  Throat:   Lips, mucosa, and tongue normal; teeth and gums normal  Neck:   Supple, symmetrical, trachea midline, no adenopathy;       thyroid:  No enlargement/tenderness/nodules; no carotid   bruit or JVD  Back:     Symmetric, no curvature, ROM normal, no CVA tenderness  Lungs:     Clear to auscultation bilaterally, respirations unlabored  Chest wall:    No tenderness or deformity  Heart:    Regular rate and rhythm, S1 and S2 normal, no murmur, rub   or gallop  Abdomen:     Soft, non-tender, obese, bowel sounds active all four quadrants,    no masses, no organomegaly  Extremities:   Extremities normal, atraumatic, no cyanosis or edema  Pulses:   2+ and symmetric all extremities  Skin:    Skin color, texture, turgor normal, no rashes or lesions. Postsurgical scar in right upper quadrant of abdomen   Lymph nodes:   Cervical, supraclavicular, and axillary nodes normal  Neurologic:   CNII-XII intact. Normal strength, sensation and reflexes      throughout      LAB RESULTS: 08/14/15 - hemoglobin 13.9, MCV 87.1, WBC 5.5, platelets 205. 07/18/15 - Hemoglobin 13.7 platelets 228 WBC 7.264% neutrophils creatinine 1.04, LFT unremarkable except AST of 44. 06/20/15 -  serum iron 80, TIBC 494, iron saturation 16%, ferritin 9  Recent Results (from the past 2160 hour(s))  CBC with Differential     Status: None   Collection Time: 12/19/15 11:38 AM  Result Value Ref Range   WBC 6.0 3.8 - 10.6 K/uL   RBC 4.96 4.40 - 5.90 MIL/uL   Hemoglobin 14.7 13.0 - 18.0 g/dL   HCT 45.1 40.0 - 52.0 %   MCV 91.0 80.0 - 100.0 fL   MCH 29.7 26.0 - 34.0 pg   MCHC 32.6 32.0 - 36.0 g/dL   RDW 14.3 11.5 - 14.5 %   Platelets 193 150 - 440 K/uL   Neutrophils Relative % 59 %   Neutro Abs 3.6 1.4 - 6.5 K/uL   Lymphocytes Relative 27 %   Lymphs Abs 1.6 1.0 - 3.6 K/uL   Monocytes Relative 10 %   Monocytes Absolute 0.6 0.2 - 1.0 K/uL   Eosinophils Relative 3 %   Eosinophils Absolute 0.2 0 - 0.7 K/uL   Basophils Relative 1 %   Basophils Absolute 0.0 0 - 0.1 K/uL  Ferritin     Status: None   Collection Time: 12/19/15 11:38 AM  Result Value Ref Range   Ferritin 52 24 - 336 ng/mL     STUDIES: 06/16/14 - Hb 9.1, MCV 76, WBC 4600, unremarkable differential, platelets 239, Cr 0.85, Ca 8.7, ferritin low at 6. On 12/17/13, Hb was 9.8, MCV 76, LFT unremarkable, albumin 4.5, ferritin low at 6.   ASSESSMENT / PLAN:   Iron deficiency Anemia  -the exact reason for iron deficiency is not clear, since extensive workup showed no clear evidence of bleeding from the GI tract. At this point ferritin appears to be well within normal range, and hemoglobin has completely recovered. We will not administer intravenous iron  at this time. We will see the patient in 4 months in our clinic with CBC and ferritin level checked at that point. He may continue ferrous sulfate every other day, if he is able to tolerate it. He will need to repeat colonoscopy in 2018. In between visits, patient advised to call or come to ER in case of any progressive anemia symptoms or sickness. He is agreeable to this plan.    Roxana Hires, MD   12/19/2015 11:46 AM

## 2016-01-04 ENCOUNTER — Encounter: Payer: Self-pay | Admitting: Family Medicine

## 2016-01-05 MED ORDER — BUPROPION HCL ER (XL) 150 MG PO TB24: 150.0000 mg | ORAL_TABLET | Freq: Every day | ORAL | Status: DC

## 2016-03-11 ENCOUNTER — Encounter: Payer: BLUE CROSS/BLUE SHIELD | Admitting: Family Medicine

## 2016-03-11 ENCOUNTER — Encounter: Payer: Self-pay | Admitting: Family Medicine

## 2016-03-26 ENCOUNTER — Encounter: Payer: Self-pay | Admitting: Family Medicine

## 2016-04-02 ENCOUNTER — Ambulatory Visit (INDEPENDENT_AMBULATORY_CARE_PROVIDER_SITE_OTHER): Payer: BLUE CROSS/BLUE SHIELD | Admitting: Family Medicine

## 2016-04-02 VITALS — BP 132/74 | HR 91 | Temp 98.0°F | Resp 16 | Wt 267.0 lb

## 2016-04-02 DIAGNOSIS — D485 Neoplasm of uncertain behavior of skin: Secondary | ICD-10-CM | POA: Diagnosis not present

## 2016-04-02 DIAGNOSIS — I10 Essential (primary) hypertension: Secondary | ICD-10-CM | POA: Diagnosis not present

## 2016-04-02 DIAGNOSIS — Z0000 Encounter for general adult medical examination without abnormal findings: Secondary | ICD-10-CM | POA: Diagnosis not present

## 2016-04-02 DIAGNOSIS — G4733 Obstructive sleep apnea (adult) (pediatric): Secondary | ICD-10-CM | POA: Diagnosis not present

## 2016-04-02 MED ORDER — NALTREXONE-BUPROPION HCL ER 8-90 MG PO TB12: ORAL_TABLET | ORAL | Status: DC

## 2016-04-02 MED ORDER — PRAMIPEXOLE DIHYDROCHLORIDE 0.5 MG PO TABS: 0.5000 mg | ORAL_TABLET | Freq: Every day | ORAL | Status: DC

## 2016-04-02 NOTE — Progress Notes (Signed)
Patient ID: Tanner Siracusa., male   DOB: [birth year], 45 y.o.   MRN: [id number]   Subjective:   Tanner Wommack. is a 45 y.o. male here for a complete physical exam  Interim issues since last visit: he goes back to see GI for 2 year f/u colonoscopy; no blood in stool, little tired, not [facility]; weird work schedule  USPSTF grade A and B recommendations Alcohol: every once in a while Depression:  Depression screen [facility] 2/9 04/02/2016  Decreased Interest 0  Down, Depressed, Hopeless 0  PHQ - 2 Score 0   Hypertension: controlled Obesity: down 7 pounds in 4 months; has cut out almost all soft drinks Tobacco use: nonsmoker HIV, hep B, hep C: willing to test, once in a lifetime, no others STD testing and prevention (chl/gon/syphilis): asx Lipids: orders today, nonfasting today Lipid Panel w/o Chol/HDL Ratio  Status: Finalresult Visible to patient:  MyChart Nextappt: 04/17/2016 at 02:00 PM in Oncology ([facility]) Dx:  Obesity            Ref Range 39mo ago    Cholesterol, Total 100 - 199 mg/dL 177   Triglycerides 0 - 149 mg/dL 69   HDL >39 mg/dL 40   Comments: According to ATP-III Guidelines, HDL-C >59 mg/dL is considered a  negative risk factor for CHD.     VLDL Cholesterol Cal 5 - 40 mg/dL 14   LDL Calculated 0 - 99 mg/dL 123 (H)        Glucose: orders today, nonfasting today Colorectal cancer: colonoscopy UTD, no 1st degree relatives Breast cancer: no lumps; grandmother had false alarm but no cancer Lung cancer: n/a Osteoporosis: n/a AAA: n/a Aspirin: at age 37 if GI issues resolved Diet: tries; less than 3 eggs per week Exercise: walking  Skin cancer: one mole side of face, present for year;s; not grown any larger; asx  Past Medical History  Diagnosis Date  . Asthma   . Hypertension   . Sleep apnea   . Chronic pain of left elbow   . Allergy   . Anemia iron deficiency  . Anxiety   . Obesity   . RLS (restless legs syndrome)   .  Dysrhythmia   . Shortness of breath dyspnea   . GERD (gastroesophageal reflux disease)   . Headache   . Arthritis   . Allergic rhinitis    Past Surgical History  Procedure Laterality Date  . Elbow fracture surgery  2007    suffered from staph and had to undo surgical procedure  . Tonsillectomy and adenoidectomy  1978  . Craniotomy  2003  . Colonoscopy with esophagogastroduodenoscopy (egd)  04/28/2014  . Cholecystectomy N/A 07/25/2015    Procedure: LAPAROSCOPIC CHOLECYSTECTOMY WITH INTRAOPERATIVE CHOLANGIOGRAM;  Surgeon: Florene Glen, MD;  Location: [facility] ORS;  Service: General;  Laterality: N/A;   Family History  Problem Relation Age of Onset  . Osteoporosis Mother 85  . Heart attack Father 36  Father had four heart attacks before he died; his kidneys failed; smoker; high cholesterol  Social History  Substance Use Topics  . Smoking status: Never Smoker   . Smokeless tobacco: Never Used  . Alcohol Use: Yes     Comment: Occasional   Review of Systems  Constitutional: Negative for unexpected weight change.  HENT: Negative for hearing loss.   Eyes: Positive for visual disturbance (may need reading glasses).  Respiratory: Negative for shortness of breath.   Cardiovascular: Negative for chest pain.  Gastrointestinal: Negative for blood  in stool.  Endocrine: Negative for cold intolerance and heat intolerance.  Genitourinary: Negative for hematuria.  Musculoskeletal: Positive for joint swelling and arthralgias.  Allergic/Immunologic: Negative for food allergies.  Neurological: Negative for tremors.  Hematological: Does not bruise/bleed easily.  Psychiatric/Behavioral: Negative for dysphoric mood.    Objective:   Filed Vitals:   04/02/16 1405  BP: 132/74  Pulse: 91  Temp: 98 F (36.7 C)  TempSrc: Oral  Resp: 16  Weight: 267 lb (121.11 kg)  SpO2: 96%   Body mass index is 40.61 kg/(m^2). Wt Readings from Last 3 Encounters:  04/02/16 267 lb (121.11 kg)  12/19/15  273 lb 9.5 oz (124.1 kg)  11/01/15 265 lb (120.203 kg)   Physical Exam  Constitutional: He appears well-developed and well-nourished. No distress.  morbidl obese  HENT:  Head: Atraumatic.  Nose: Nose normal.  Mouth/Throat: Oropharynx is clear and moist.  Eyes: EOM are normal. No scleral icterus.  Neck: No JVD present. No thyromegaly present.  Cardiovascular: Normal rate, regular rhythm and normal heart sounds.   Pulmonary/Chest: Effort normal and breath sounds normal. No respiratory distress. He has no wheezes. He has no rales.  Abdominal: Soft. Bowel sounds are normal. He exhibits no distension. There is no tenderness. There is no guarding.  Musculoskeletal: Normal range of motion. He exhibits no edema.       Right forearm: He exhibits deformity.       Left forearm: He exhibits deformity.  Lymphadenopathy:    He has no cervical adenopathy.  Neurological: He is alert. He displays normal reflexes. He exhibits normal muscle tone. Coordination normal.  Skin: Skin is warm and dry. Lesion (melanocytic lesion on face) noted. No rash noted. He is not diaphoretic. No erythema. No pallor.  Psychiatric: He has a normal mood and affect. His behavior is normal. Judgment and thought content normal.   Assessment/Plan:   Problem List Items Addressed This Visit      Cardiovascular and Mediastinum   Benign hypertension    Controlled fairly well today; significant weight loss would help his pressures        Respiratory   Obstructive sleep apnea    Expect weight loss to help with OSA        Musculoskeletal and Integument   Neoplasm of uncertain behavior of skin of face   Relevant Orders   Ambulatory referral to Dermatology     Other   Morbidly obese [facility])    Discussed recent / long-term weight loss strugle with patient; discussed medical options; he agrees to start medical weight loss therapy in addition to proper diet and activity; discussed possible side effects; return for close f/u;  see AVS      Relevant Medications   Naltrexone-Bupropion HCl ER 8-90 MG TB12   Preventative health care - Primary    USPSTF grade A and B recommendations reviewed with patient; age-appropriate recommendations, preventive care, screening tests, etc discussed and encouraged; healthy living encouraged; see AVS for patient education given to patient      Relevant Orders   CBC with Differential/Platelet   Lipid Panel w/o Chol/HDL Ratio   Comprehensive metabolic panel      Meds ordered this encounter  Medications  . Naltrexone-Bupropion HCl ER 8-90 MG TB12    Sig: One by mouth every AM x 1 week, then one BID x 1 week, then 2 AM and 1 PM x 1 week, then 2 BID    Dispense:  98 tablet    Refill:  0  STOP Wellbutrin XL  . pramipexole (MIRAPEX) 0.5 MG tablet    Sig: Take 1 tablet (0.5 mg total) by mouth at bedtime.    Dispense:  90 tablet    Refill:  3   Orders Placed This Encounter  Procedures  . CBC with Differential/Platelet  . Lipid Panel w/o Chol/HDL Ratio    Order Specific Question:  Has the patient fasted?    Answer:  Yes  . Comprehensive metabolic panel    Order Specific Question:  Has the patient fasted?    Answer:  Yes  . Ambulatory referral to Dermatology    Referral Priority:  Routine    Referral Type:  Consultation    Referral Reason:  Specialty Services Required    Requested Specialty:  Dermatology    Number of Visits Requested:  1    Follow up plan: Return in about 4 weeks (around 04/30/2016) for weight managment; 1 year for next physical.  An After Visit Summary was printed and given to the patient.

## 2016-04-02 NOTE — Patient Instructions (Addendum)
STOP the Wellbutrin Start the Contrave, taper up by one pill each week Return in 4 weeks for follow-up Have labs at your convenience fasting and sent to me  Health Maintenance, Male A healthy lifestyle and preventative care can promote health and wellness.  Maintain regular health, dental, and eye exams.  Eat a healthy diet. Foods like vegetables, fruits, whole grains, low-fat dairy products, and lean protein foods contain the nutrients you need and are low in calories. Decrease your intake of foods high in solid fats, added sugars, and salt. Get information about a proper diet from your health care provider, if necessary.  Regular physical exercise is one of the most important things you can do for your health. Most adults should get at least 150 minutes of moderate-intensity exercise (any activity that increases your heart rate and causes you to sweat) each week. In addition, most adults need muscle-strengthening exercises on 2 or more days a week.   Maintain a healthy weight. The body mass index (BMI) is a screening tool to identify possible weight problems. It provides an estimate of body fat based on height and weight. Your health care provider can find your BMI and can help you achieve or maintain a healthy weight. For males 20 years and older:  A BMI below 18.5 is considered underweight.  A BMI of 18.5 to 24.9 is normal.  A BMI of 25 to 29.9 is considered overweight.  A BMI of 30 and above is considered obese.  Maintain normal blood lipids and cholesterol by exercising and minimizing your intake of saturated fat. Eat a balanced diet with plenty of fruits and vegetables. Blood tests for lipids and cholesterol should begin at age 21 and be repeated every 5 years. If your lipid or cholesterol levels are high, you are over age 60, or you are at high risk for heart disease, you may need your cholesterol levels checked more frequently.Ongoing high lipid and cholesterol levels should be  treated with medicines if diet and exercise are not working.  If you smoke, find out from your health care provider how to quit. If you do not use tobacco, do not start.  Lung cancer screening is recommended for adults aged 37-80 years who are at high risk for developing lung cancer because of a history of smoking. A yearly low-dose CT scan of the lungs is recommended for people who have at least a 30-pack-year history of smoking and are current smokers or have quit within the past 15 years. A pack year of smoking is smoking an average of 1 pack of cigarettes a day for 1 year (for example, a 30-pack-year history of smoking could mean smoking 1 pack a day for 30 years or 2 packs a day for 15 years). Yearly screening should continue until the smoker has stopped smoking for at least 15 years. Yearly screening should be stopped for people who develop a health problem that would prevent them from having lung cancer treatment.  If you choose to drink alcohol, do not have more than 2 drinks per day. One drink is considered to be 12 oz (360 mL) of beer, 5 oz (150 mL) of wine, or 1.5 oz (45 mL) of liquor.  Avoid the use of street drugs. Do not share needles with anyone. Ask for help if you need support or instructions about stopping the use of drugs.  High blood pressure causes heart disease and increases the risk of stroke. High blood pressure is more likely to develop in:  People who have blood pressure in the end of the normal range (100-139/85-89 mm Hg).  People who are overweight or obese.  People who are African American.  If you are 89-23 years of age, have your blood pressure checked every 3-5 years. If you are 33 years of age or older, have your blood pressure checked every year. You should have your blood pressure measured twice--once when you are at a hospital or clinic, and once when you are not at a hospital or clinic. Record the average of the two measurements. To check your blood pressure  when you are not at a hospital or clinic, you can use:  An automated blood pressure machine at a pharmacy.  A home blood pressure monitor.  If you are 66-39 years old, ask your health care provider if you should take aspirin to prevent heart disease.  Diabetes screening involves taking a blood sample to check your fasting blood sugar level. This should be done once every 3 years after age 72 if you are at a normal weight and without risk factors for diabetes. Testing should be considered at a younger age or be carried out more frequently if you are overweight and have at least 1 risk factor for diabetes.  Colorectal cancer can be detected and often prevented. Most routine colorectal cancer screening begins at the age of 22 and continues through age 32. However, your health care provider may recommend screening at an earlier age if you have risk factors for colon cancer. On a yearly basis, your health care provider may provide home test kits to check for hidden blood in the stool. A small camera at the end of a tube may be used to directly examine the colon (sigmoidoscopy or colonoscopy) to detect the earliest forms of colorectal cancer. Talk to your health care provider about this at age 47 when routine screening begins. A direct exam of the colon should be repeated every 5-10 years through age 47, unless early forms of precancerous polyps or small growths are found.  People who are at an increased risk for hepatitis B should be screened for this virus. You are considered at high risk for hepatitis B if:  You were born in a country where hepatitis B occurs often. Talk with your health care provider about which countries are considered high risk.  Your parents were born in a high-risk country and you have not received a shot to protect against hepatitis B (hepatitis B vaccine).  You have HIV or AIDS.  You use needles to inject street drugs.  You live with, or have sex with, someone who has  hepatitis B.  You are a man who has sex with other men (MSM).  You get hemodialysis treatment.  You take certain medicines for conditions like cancer, organ transplantation, and autoimmune conditions.  Hepatitis C blood testing is recommended for all people born from [birth year] through 1965 and any individual with known risk factors for hepatitis C.  Healthy men should no longer receive prostate-specific antigen (PSA) blood tests as part of routine cancer screening. Talk to your health care provider about prostate cancer screening.  Testicular cancer screening is not recommended for adolescents or adult males who have no symptoms. Screening includes self-exam, a health care provider exam, and other screening tests. Consult with your health care provider about any symptoms you have or any concerns you have about testicular cancer.  Practice safe sex. Use condoms and avoid high-risk sexual practices to reduce the spread of  sexually transmitted infections (STIs).  You should be screened for STIs, including gonorrhea and chlamydia if:  You are sexually active and are younger than 24 years.  You are older than 24 years, and your health care provider tells you that you are at risk for this type of infection.  Your sexual activity has changed since you were last screened, and you are at an increased risk for chlamydia or gonorrhea. Ask your health care provider if you are at risk.  If you are at risk of being infected with HIV, it is recommended that you take a prescription medicine daily to prevent HIV infection. This is called pre-exposure prophylaxis (PrEP). You are considered at risk if:  You are a man who has sex with other men (MSM).  You are a heterosexual man who is sexually active with multiple partners.  You take drugs by injection.  You are sexually active with a partner who has HIV.  Talk with your health care provider about whether you are at high risk of being infected with HIV. If  you choose to begin PrEP, you should first be tested for HIV. You should then be tested every 3 months for as long as you are taking PrEP.  Use sunscreen. Apply sunscreen liberally and repeatedly throughout the day. You should seek shade when your shadow is shorter than you. Protect yourself by wearing long sleeves, pants, a wide-brimmed hat, and sunglasses year round whenever you are outdoors.  Tell your health care provider of new moles or changes in moles, especially if there is a change in shape or color. Also, tell your health care provider if a mole is larger than the size of a pencil eraser.  A one-time screening for abdominal aortic aneurysm (AAA) and surgical repair of large AAAs by ultrasound is recommended for men aged 50-75 years who are current or former smokers.  Stay current with your vaccines (immunizations).   This information is not intended to replace advice given to you by your health care provider. Make sure you discuss any questions you have with your health care provider.   Document Released: 05/16/2008 Document Revised: 12/09/2014 Document Reviewed: 04/15/2011 Elsevier Interactive Patient Education [employer].

## 2016-04-16 ENCOUNTER — Other Ambulatory Visit: Payer: Self-pay | Admitting: *Deleted

## 2016-04-16 DIAGNOSIS — D509 Iron deficiency anemia, unspecified: Secondary | ICD-10-CM

## 2016-04-17 ENCOUNTER — Inpatient Hospital Stay: Payer: BLUE CROSS/BLUE SHIELD | Admitting: Family Medicine

## 2016-04-17 ENCOUNTER — Inpatient Hospital Stay: Payer: BLUE CROSS/BLUE SHIELD

## 2016-04-23 ENCOUNTER — Inpatient Hospital Stay: Payer: BLUE CROSS/BLUE SHIELD | Admitting: Family Medicine

## 2016-04-23 ENCOUNTER — Inpatient Hospital Stay: Payer: BLUE CROSS/BLUE SHIELD

## 2016-04-28 ENCOUNTER — Encounter: Payer: Self-pay | Admitting: Family Medicine

## 2016-04-28 DIAGNOSIS — Z0000 Encounter for general adult medical examination without abnormal findings: Secondary | ICD-10-CM | POA: Insufficient documentation

## 2016-04-28 NOTE — Assessment & Plan Note (Signed)
Expect weight loss to help with OSA

## 2016-04-28 NOTE — Assessment & Plan Note (Signed)
USPSTF grade A and B recommendations reviewed with patient; age-appropriate recommendations, preventive care, screening tests, etc discussed and encouraged; healthy living encouraged; see AVS for patient education given to patient  

## 2016-04-28 NOTE — Assessment & Plan Note (Signed)
Controlled fairly well today; significant weight loss would help his pressures

## 2016-04-28 NOTE — Assessment & Plan Note (Signed)
Discussed recent / long-term weight loss strugle with patient; discussed medical options; he agrees to start medical weight loss therapy in addition to proper diet and activity; discussed possible side effects; return for close f/u; see AVS

## 2016-04-30 ENCOUNTER — Ambulatory Visit: Payer: BLUE CROSS/BLUE SHIELD | Admitting: Family Medicine

## 2016-05-02 ENCOUNTER — Ambulatory Visit: Payer: BLUE CROSS/BLUE SHIELD | Admitting: Family Medicine

## 2016-05-03 ENCOUNTER — Other Ambulatory Visit: Payer: Self-pay | Admitting: Family Medicine

## 2016-05-03 ENCOUNTER — Encounter: Payer: Self-pay | Admitting: Family Medicine

## 2016-05-06 MED ORDER — METOPROLOL SUCCINATE ER 25 MG PO TB24: 25.0000 mg | ORAL_TABLET | Freq: Every day | ORAL | Status: DC

## 2016-05-06 MED ORDER — HYDROCHLOROTHIAZIDE 25 MG PO TABS: 25.0000 mg | ORAL_TABLET | Freq: Every day | ORAL | Status: DC

## 2016-05-07 ENCOUNTER — Inpatient Hospital Stay: Payer: BLUE CROSS/BLUE SHIELD

## 2016-05-07 ENCOUNTER — Inpatient Hospital Stay: Payer: BLUE CROSS/BLUE SHIELD | Admitting: Family Medicine

## 2016-05-07 ENCOUNTER — Other Ambulatory Visit: Payer: Self-pay | Admitting: Family Medicine

## 2016-05-07 MED ORDER — NALTREXONE-BUPROPION HCL ER 8-90 MG PO TB12: 2.0000 | ORAL_TABLET | Freq: Two times a day (BID) | ORAL | Status: DC

## 2016-05-27 IMAGING — NM NM HEPATO W/GB/PHARM/[PERSON_NAME]
2 series · 12 of 12 positions shown · non-contrast
Comparison: Hepatobiliary scan in Saturday December, 2011 which revealed an
abnormally low gallbladder ejection fraction.

CLINICAL DATA: Nausea vomiting and abdominal pain for the past
month

EXAM:
NUCLEAR MEDICINE HEPATOBILIARY IMAGING WITH GALLBLADDER EF
TECHNIQUE: Sequential images of the abdomen were obtained [DATE] minutes
following intravenous administration of radiopharmaceutical. After
slow intravenous infusion of 2.4 micrograms Cholecystokinin,
gallbladder ejection fraction was determined.
RADIOPHARMACEUTICALS:  5.36 mCi Bc-OOm Choletec IV

[Series 1000: gallbladder ef · 4.80mm/px · 6 of 120 frames shown]
[frame 11/120]
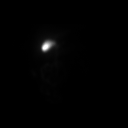
[frame 31/120]
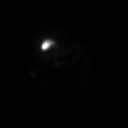
[frame 51/120]
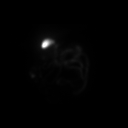
[frame 71/120]
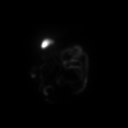
[frame 91/120]
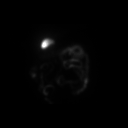
[frame 111/120]
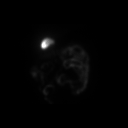

[Series 1000: hepatobiliary scan · 9.59mm/px · 6 of 60 frames shown]
[frame 6/60]
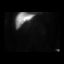
[frame 16/60]
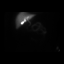
[frame 26/60]
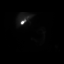
[frame 36/60]
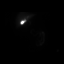
[frame 46/60]
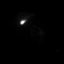
[frame 56/60]
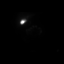

[12 of 12 positions shown; findings below may reference images not displayed]

FINDINGS: There is adequate uptake of the radiopharmaceutical by the liver.
The gallbladder and common bile duct are visible by 10 minutes and
the bowel is visible by 15 minutes. The gallbladder ejection
fraction is abnormally low at 37% at 60 minutes. No discomfort
during CCK administration was described.. At 45 min, normal ejection
fraction is greater than 40%.
IMPRESSION: Abnormally low gallbladder ejection fraction though not as low as
that demonstrated previously. There is no evidence of cystic duct or
common bile duct obstruction.

## 2016-05-29 ENCOUNTER — Inpatient Hospital Stay: Payer: BLUE CROSS/BLUE SHIELD

## 2016-05-29 ENCOUNTER — Inpatient Hospital Stay: Payer: BLUE CROSS/BLUE SHIELD | Admitting: Family Medicine

## 2016-06-11 ENCOUNTER — Other Ambulatory Visit: Payer: Self-pay

## 2016-06-11 MED ORDER — RIZATRIPTAN BENZOATE 10 MG PO TBDP: ORAL_TABLET | ORAL | Status: DC

## 2016-06-17 ENCOUNTER — Encounter: Payer: Self-pay | Admitting: Family Medicine

## 2016-06-18 ENCOUNTER — Inpatient Hospital Stay: Payer: BLUE CROSS/BLUE SHIELD

## 2016-06-18 ENCOUNTER — Inpatient Hospital Stay: Payer: BLUE CROSS/BLUE SHIELD | Admitting: Family Medicine

## 2016-06-19 MED ORDER — BUPROPION HCL ER (XL) 150 MG PO TB24: 150.0000 mg | ORAL_TABLET | Freq: Every day | ORAL | Status: DC

## 2016-06-25 IMAGING — CR DG CHOLANGIOGRAM OPERATIVE
5 series · 15 of 47 positions shown · non-contrast
Comparison: None.

CLINICAL DATA: Cholecystitis

EXAM:
INTRAOPERATIVE CHOLANGIOGRAM
TECHNIQUE: Cholangiographic images from the C-arm fluoroscopic device were
submitted for interpretation post-operatively. Please see the
procedural report for the amount of contrast and the fluoroscopy
time utilized.

[Series 1: cont. · 3 of 13 frames shown (1 of 5)]
[frame 2/13]
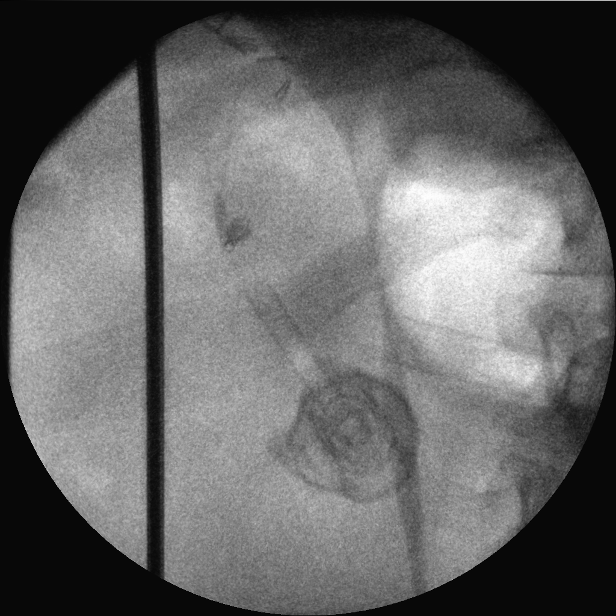
[frame 6/13]
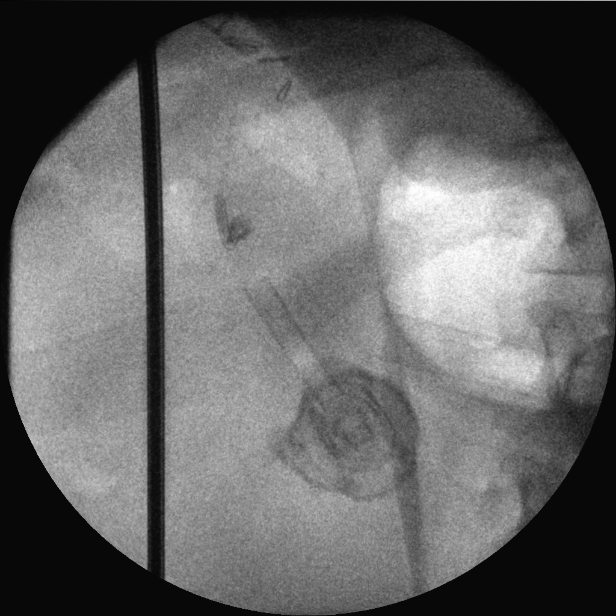
[frame 10/13]
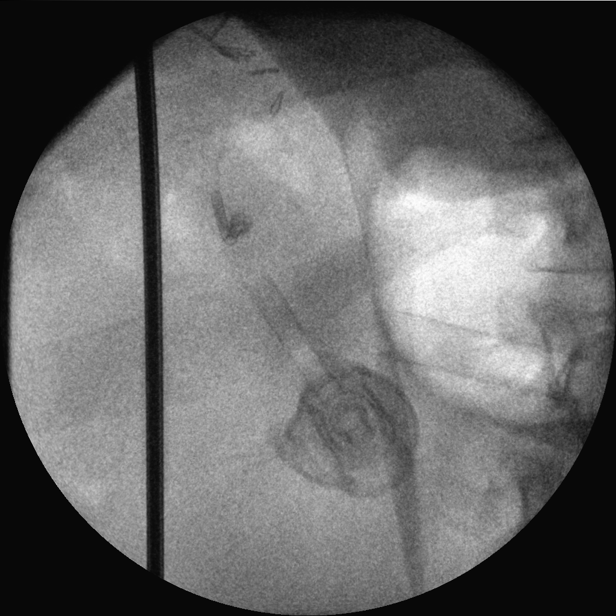

[Series 2: cont. · 3 of 52 frames shown (2 of 5)]
[frame 1/52]
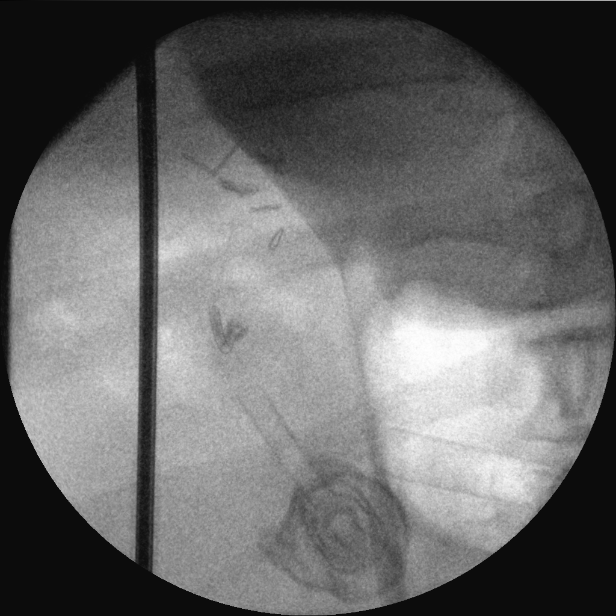
[frame 23/52]
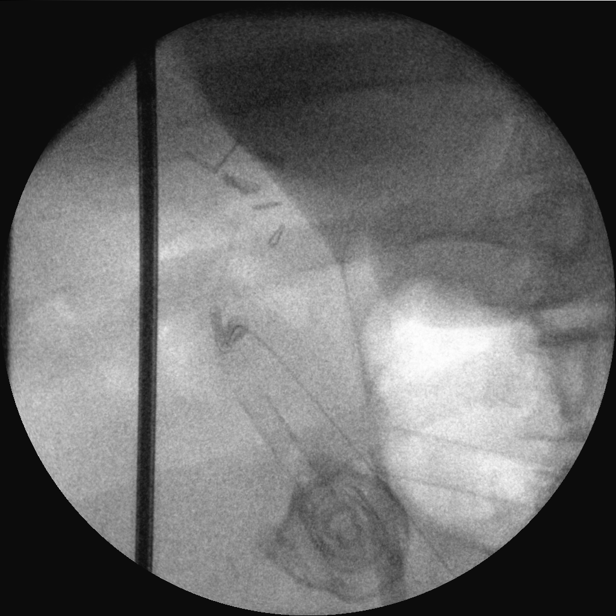
[frame 40/52]
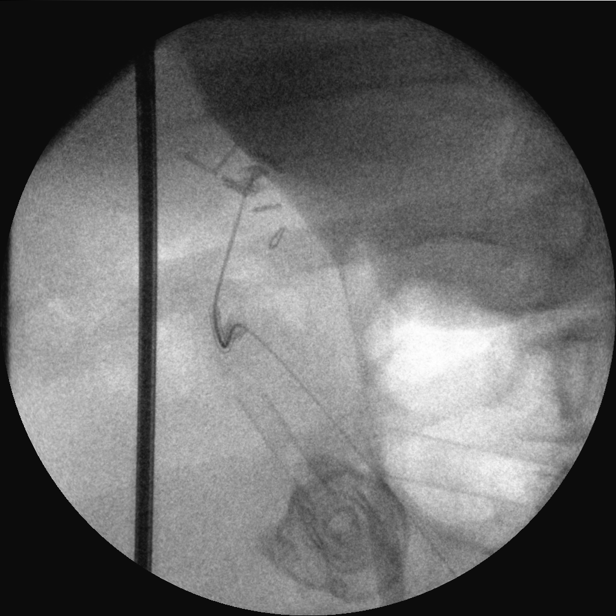

[Series 3: cont. · 4 of 66 frames shown (3 of 5)]
[frame 1/66]
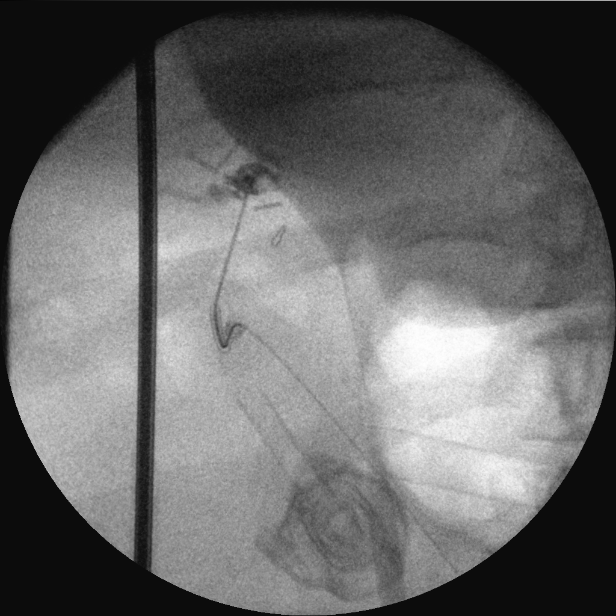
[frame 22/66]
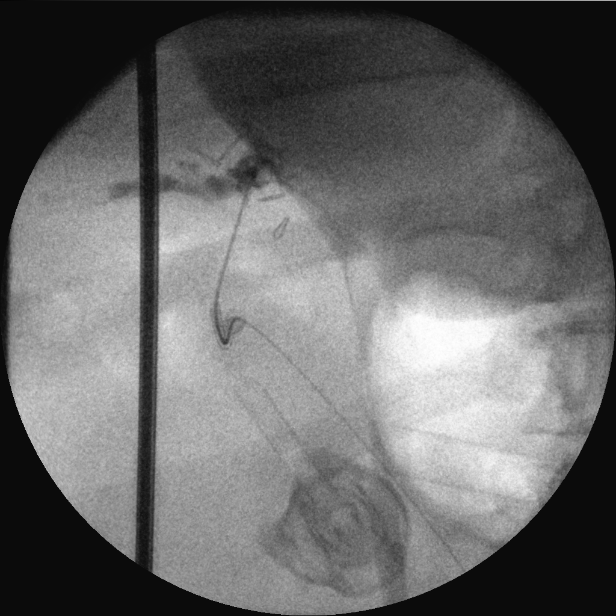
[frame 44/66]
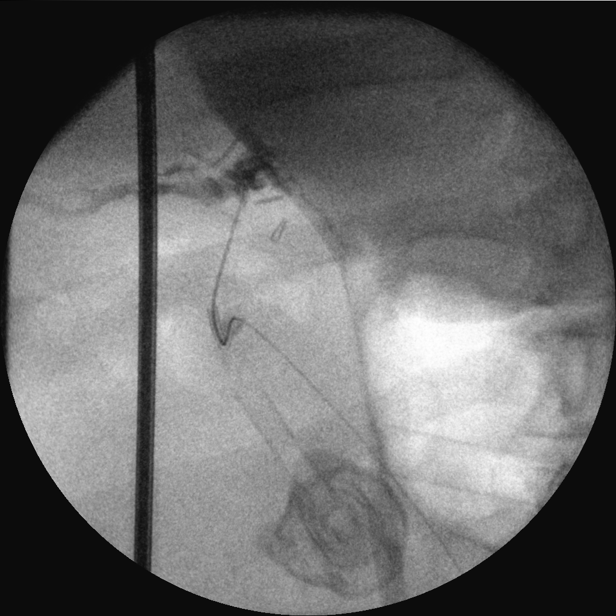
[frame 66/66]
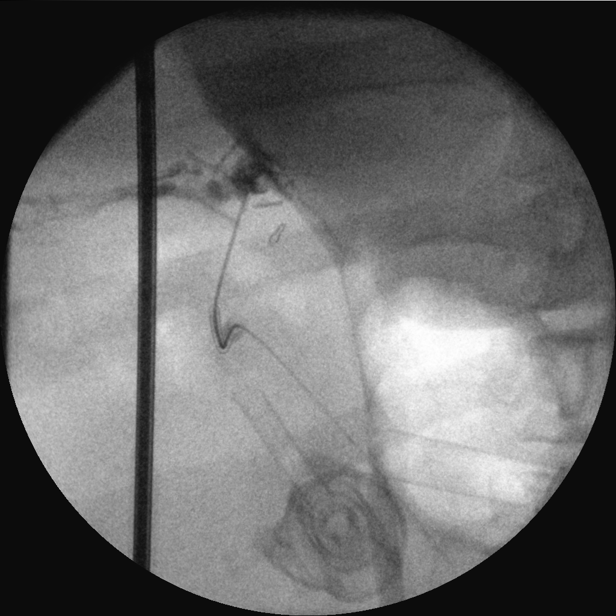

[Series 4: cont. · 2 of 7 frames shown (4 of 5)]
[frame 3/7]
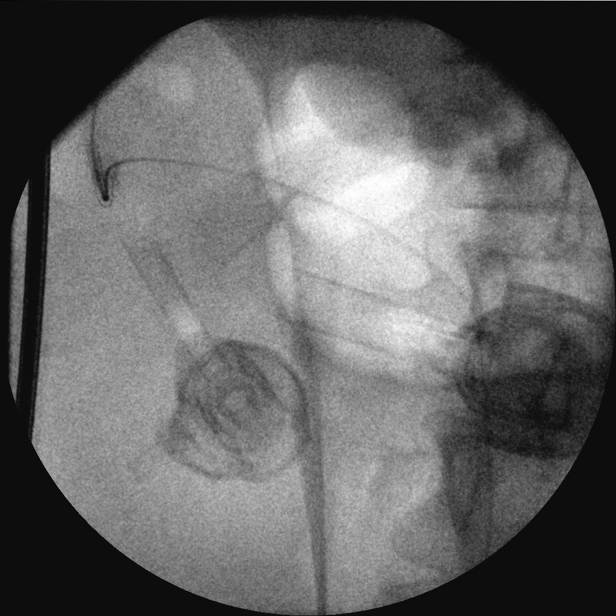
[frame 7/7]
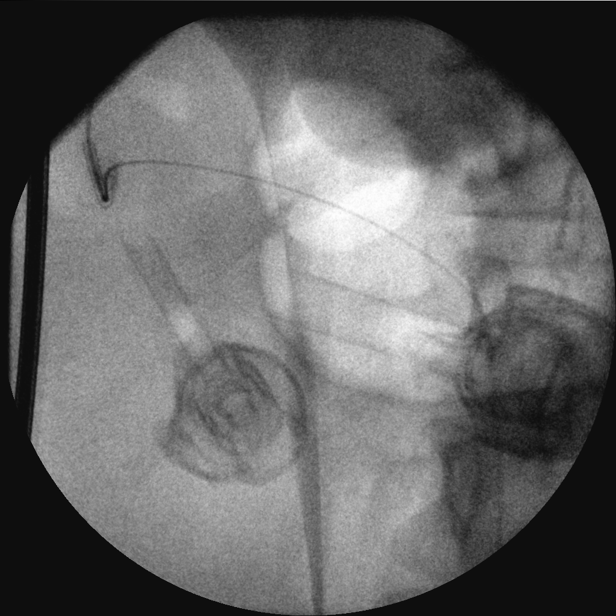

[Series 5: cont. · 3 of 82 frames shown (5 of 5)]
[frame 19/82]
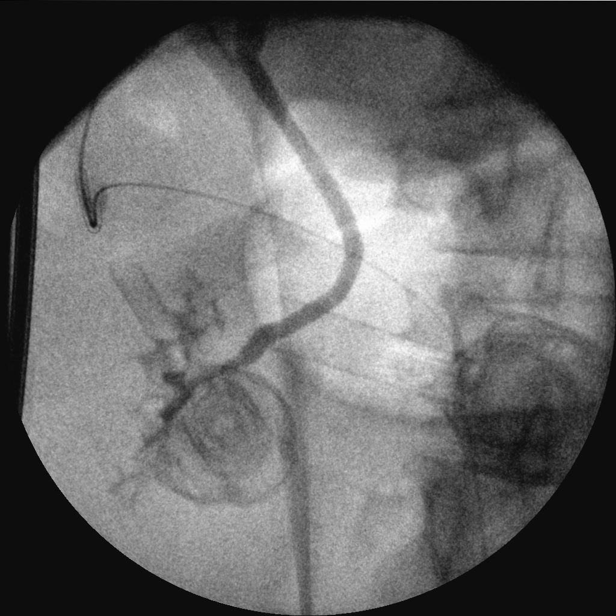
[frame 46/82]
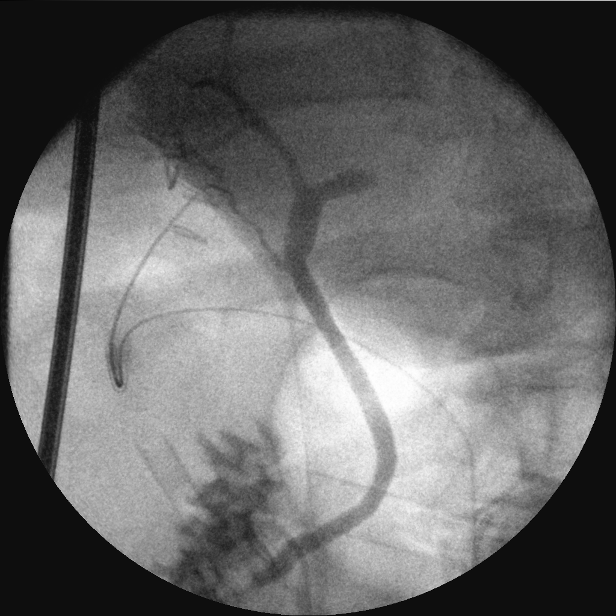
[frame 73/82]
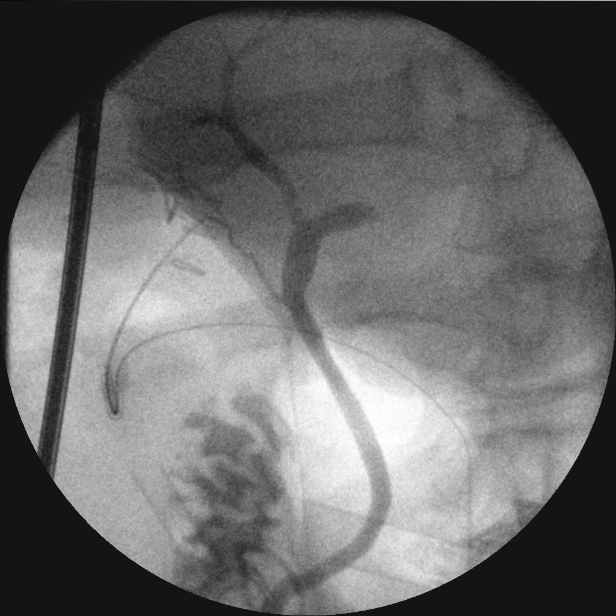

[15 of 47 positions shown; findings below may reference images not displayed]

FINDINGS: Injection in the cystic duct remnant reveals opacification of the
intra and extrahepatic biliary tree. Free flow contrast into the
duodenum is seen. No definitive filling defect is noted.
IMPRESSION: No definitive retained stone.

## 2016-07-02 ENCOUNTER — Ambulatory Visit: Payer: BLUE CROSS/BLUE SHIELD | Admitting: Oncology

## 2016-07-02 ENCOUNTER — Other Ambulatory Visit: Payer: BLUE CROSS/BLUE SHIELD

## 2016-07-04 ENCOUNTER — Ambulatory Visit: Payer: BLUE CROSS/BLUE SHIELD

## 2016-07-04 ENCOUNTER — Ambulatory Visit
Admission: RE | Admit: 2016-07-04 | Discharge: 2016-07-04 | Disposition: A | Discharge status: Home / Self Care | Payer: BLUE CROSS/BLUE SHIELD | Source: Ambulatory Visit | Attending: Family Medicine | Admitting: Family Medicine

## 2016-07-04 ENCOUNTER — Encounter: Payer: Self-pay | Admitting: Family Medicine

## 2016-07-04 ENCOUNTER — Ambulatory Visit (INDEPENDENT_AMBULATORY_CARE_PROVIDER_SITE_OTHER): Payer: BLUE CROSS/BLUE SHIELD | Admitting: Family Medicine

## 2016-07-04 ENCOUNTER — Other Ambulatory Visit: Payer: Self-pay

## 2016-07-04 VITALS — BP 126/78 | HR 78 | Temp 98.5°F | Resp 14 | Wt 263.0 lb

## 2016-07-04 DIAGNOSIS — D509 Iron deficiency anemia, unspecified: Secondary | ICD-10-CM

## 2016-07-04 DIAGNOSIS — R937 Abnormal findings on diagnostic imaging of other parts of musculoskeletal system: Secondary | ICD-10-CM | POA: Insufficient documentation

## 2016-07-04 DIAGNOSIS — I10 Essential (primary) hypertension: Secondary | ICD-10-CM | POA: Diagnosis not present

## 2016-07-04 DIAGNOSIS — K219 Gastro-esophageal reflux disease without esophagitis: Secondary | ICD-10-CM | POA: Diagnosis not present

## 2016-07-04 DIAGNOSIS — Z5181 Encounter for therapeutic drug level monitoring: Secondary | ICD-10-CM

## 2016-07-04 DIAGNOSIS — M25521 Pain in right elbow: Secondary | ICD-10-CM

## 2016-07-04 LAB — CBC WITH DIFFERENTIAL/PLATELET
Basophils Absolute: 63 cells/uL (ref 0–200)
Basophils Relative: 1 %
Eosinophils Absolute: 189 cells/uL (ref 15–500)
Eosinophils Relative: 3 %
HCT: 45.6 % (ref 38.5–50.0)
Hemoglobin: 15.1 g/dL (ref 13.2–17.1)
LYMPHS ABS: 1701 {cells}/uL (ref 850–3900)
Lymphocytes Relative: 27 %
MCH: 29.3 pg (ref 27.0–33.0)
MCHC: 33.1 g/dL (ref 32.0–36.0)
MCV: 88.5 fL (ref 80.0–100.0)
MONOS PCT: 11 %
MPV: 11.6 fL (ref 7.5–12.5)
Monocytes Absolute: 693 cells/uL (ref 200–950)
NEUTROS PCT: 58 %
Neutro Abs: 3654 cells/uL (ref 1500–7800)
PLATELETS: 232 10*3/uL (ref 140–400)
RBC: 5.15 MIL/uL (ref 4.20–5.80)
RDW: 13.8 % (ref 11.0–15.0)
WBC: 6.3 10*3/uL (ref 3.8–10.8)

## 2016-07-04 LAB — COMPREHENSIVE METABOLIC PANEL
ALK PHOS: 75 U/L (ref 40–115)
ALT: 20 U/L (ref 9–46)
AST: 20 U/L (ref 10–40)
Albumin: 4.1 g/dL (ref 3.6–5.1)
BUN: 7 mg/dL (ref 7–25)
CALCIUM: 9.7 mg/dL (ref 8.6–10.3)
CO2: 33 mmol/L — AB (ref 20–31)
Chloride: 101 mmol/L (ref 98–110)
Creat: 0.98 mg/dL (ref 0.60–1.35)
GLUCOSE: 84 mg/dL (ref 65–99)
POTASSIUM: 4.3 mmol/L (ref 3.5–5.3)
Sodium: 142 mmol/L (ref 135–146)
Total Bilirubin: 0.6 mg/dL (ref 0.2–1.2)
Total Protein: 6.7 g/dL (ref 6.1–8.1)

## 2016-07-04 LAB — LIPID PANEL
CHOL/HDL RATIO: 4.2 ratio (ref <=5.0)
Cholesterol: 171 mg/dL (ref 125–200)
HDL: 41 mg/dL (ref >=40)
LDL Cholesterol: 117 mg/dL (ref <130)
Triglycerides: 66 mg/dL (ref <150)
VLDL: 13 mg/dL (ref <30)

## 2016-07-04 LAB — FERRITIN: Ferritin: 34 ng/mL (ref 20–380)

## 2016-07-04 MED ORDER — CELECOXIB 100 MG PO CAPS: ORAL_CAPSULE | ORAL | 0 refills | Status: DC

## 2016-07-04 NOTE — Assessment & Plan Note (Signed)
Check lipids and glucose

## 2016-07-04 NOTE — Patient Instructions (Signed)
We'll get labs today Avoid pressure on the right elbow You can get the xray across the street after you leave here Let's try the Celebrex for pain and inflammation Heat topically 15-20 minutes, always protect skin from temperature source Call me if not getting better

## 2016-07-04 NOTE — Assessment & Plan Note (Signed)
Check creatinine 

## 2016-07-04 NOTE — Progress Notes (Signed)
BP 126/78   Pulse 78   Temp 98.5 F (36.9 C) (Oral)   Resp 14   Wt 263 lb (119.3 kg)   SpO2 95%   BMI 39.99 kg/m    Subjective:    Patient ID: Tanner Quan., male    DOB: [birth year], 45 y.o.   MRN: [id number]  HPI: Tanner Ryan is a 45 y.o. male  Chief Complaint  Patient presents with  . Elbow Pain    right    He is having right elbow time; he is pretty active, no change in activity; no recent injuries Feels like a muscle strain He has tried ibuprofen, but has to be careful with his stomach issues He has tried a heating pad and that helps No muscle weakness Does feel tingling in 4th and 5th fingers; worse with constant gripping and then gets better with short rest He does rest his arm on a desk on the same spot on the right elbow Right-handed Nothing similar in the past  He has never tried celebrex; he has taken ibuprofen and naproxen and both of those will upset stomach without eating; he has not had any acid reflux since choly on the PPI; avoid triggers; he'll have symptoms if he eats something he shouldn't; no GI bleeding or ulcer; he has hx of anemia  He goes back on August 14th for recheck of labs but not sure he can get off of work that day; energy level is same old same old; work schedule is screwy; no SHOB or chest pain; he has changed diet to get more iron-rich foods   Depression screen [facility] 2/9 07/04/2016 04/02/2016  Decreased Interest 0 0  Down, Depressed, Hopeless 0 0  PHQ - 2 Score 0 0    No flowsheet data found.  Relevant past medical, surgical, family and social history reviewed Past Medical History:  Diagnosis Date  . Allergic rhinitis   . Allergy   . Anemia iron deficiency  . Anxiety   . Arthritis   . Asthma   . Chronic pain of left elbow   . Dysrhythmia   . GERD (gastroesophageal reflux disease)   . Headache   . Hypertension   . Obesity   . RLS (restless legs syndrome)   . Shortness of breath dyspnea   . Sleep apnea    Past  Surgical History:  Procedure Laterality Date  . CHOLECYSTECTOMY N/A 07/25/2015   Procedure: LAPAROSCOPIC CHOLECYSTECTOMY WITH INTRAOPERATIVE CHOLANGIOGRAM;  Surgeon: Florene Glen, MD;  Location: [facility] ORS;  Service: General;  Laterality: N/A;  . COLONOSCOPY WITH ESOPHAGOGASTRODUODENOSCOPY (EGD)  04/28/2014  . CRANIOTOMY  2003  . ELBOW FRACTURE SURGERY  2007   suffered from [place] and had to undo surgical procedure  . TONSILLECTOMY AND ADENOIDECTOMY  1978   Family History  Problem Relation Age of Onset  . Osteoporosis Mother 13  . Heart attack Father 45  . Heart disease Father    Social History  Substance Use Topics  . Smoking status: Never Smoker  . Smokeless tobacco: Never Used  . Alcohol use Yes     Comment: Occasional    Interim medical history since last visit reviewed. Allergies and medications reviewed  Review of Systems Per HPI unless specifically indicated above     Objective:    BP 126/78   Pulse 78   Temp 98.5 F (36.9 C) (Oral)   Resp 14   Wt 263 lb (119.3 kg)   SpO2 95%  BMI 39.99 kg/m   Wt Readings from Last 3 Encounters:  07/04/16 263 lb (119.3 kg)  04/02/16 267 lb (121.1 kg)  12/19/15 273 lb 9.5 oz (124.1 kg)    Physical Exam  Constitutional: He appears well-developed and well-nourished. No distress.  morbidl obese  HENT:  Head: Atraumatic.  Nose: Nose normal.  Mouth/Throat: Oropharynx is clear and moist.  Eyes: EOM are normal. No scleral icterus.  Neck: No JVD present. No thyromegaly present.  Cardiovascular: Normal rate, regular rhythm and normal heart sounds.   Pulmonary/Chest: Effort normal and breath sounds normal.  Abdominal: Soft. He exhibits no distension. There is no tenderness.  Musculoskeletal: Normal range of motion. He exhibits no edema.       Right forearm: He exhibits deformity.       Left forearm: He exhibits tenderness and deformity. He exhibits no swelling, no edema and no laceration.  Limited ROM of right elbow; tender  along course of ulnar nerve  Neurological: He is alert. He exhibits normal muscle tone. Coordination normal.  Decreased strength, pinky to thumb abduction weak, examiner able to break through; thumb to index finger intact; grip 5/5  Skin: Skin is warm and dry. Lesion (melanocytic lesion on face) noted. He is not diaphoretic. No erythema. No pallor.  Psychiatric: He has a normal mood and affect. His behavior is normal. Judgment and thought content normal. His mood appears not anxious. He does not exhibit a depressed mood.   Results for orders placed or performed in visit on 12/19/15  CBC with Differential  Result Value Ref Range   WBC 6.0 3.8 - 10.6 K/uL   RBC 4.96 4.40 - 5.90 MIL/uL   Hemoglobin 14.7 13.0 - 18.0 g/dL   HCT 45.1 40.0 - 52.0 %   MCV 91.0 80.0 - 100.0 fL   MCH 29.7 26.0 - 34.0 pg   MCHC 32.6 32.0 - 36.0 g/dL   RDW 14.3 11.5 - 14.5 %   Platelets 193 150 - 440 K/uL   Neutrophils Relative % 59 %   Neutro Abs 3.6 1.4 - 6.5 K/uL   Lymphocytes Relative 27 %   Lymphs Abs 1.6 1.0 - 3.6 K/uL   Monocytes Relative 10 %   Monocytes Absolute 0.6 0.2 - 1.0 K/uL   Eosinophils Relative 3 %   Eosinophils Absolute 0.2 0 - 0.7 K/uL   Basophils Relative 1 %   Basophils Absolute 0.0 0 - 0.1 K/uL  Ferritin  Result Value Ref Range   Ferritin 52 24 - 336 ng/mL      Assessment & Plan:   Problem List Items Addressed This Visit      Cardiovascular and Mediastinum   Benign hypertension    Well-controlled; watch BP at home or work a few times a week while on NSAID and let me know if going up        Digestive   GERD (gastroesophageal reflux disease)    Avoid other NSAIDs, will use celebrex; let me know if symptoms worsen on med though        Other   Right elbow pain - Primary    Discussed ddx, try anti-inflammatory and call if not better for referral to ortho; get xray      Relevant Orders   DG Elbow Complete Right   Morbidly obese (HCC)    Check lipids and glucose       Relevant Orders   Lipid panel   IDA (iron deficiency anemia)    Understand patient's work  schedule and I will be glad to order CBC and ferritin today      Relevant Orders   CBC with Differential/Platelet   Ferritin   Encounter for medication monitoring    Check creatinine      Relevant Orders   Comprehensive metabolic panel    Other Visit Diagnoses   None.      Follow up plan: No Follow-up on file.  An after-visit summary was printed and given to the patient at [facility].  Please see the patient instructions which may contain other information and recommendations beyond what is mentioned above in the assessment and plan.  Meds ordered this encounter  Medications  . celecoxib (CELEBREX) 100 MG capsule    Sig: One by mouth once or twice a day if needed for elbow pain    Dispense:  60 capsule    Refill:  0    Has GERD; has anemia; failed ibuprofen and naproxen   Orders Placed This Encounter  Procedures  . DG Elbow Complete Right  . CBC with Differential/Platelet  . Ferritin  . Comprehensive metabolic panel  . Lipid panel

## 2016-07-04 NOTE — Assessment & Plan Note (Signed)
Avoid other NSAIDs, will use celebrex; let me know if symptoms worsen on med though

## 2016-07-04 NOTE — Assessment & Plan Note (Signed)
Discussed ddx, try anti-inflammatory and call if not better for referral to ortho; get xray

## 2016-07-04 NOTE — Assessment & Plan Note (Signed)
Understand patient's work schedule and I will be glad to order CBC and ferritin today

## 2016-07-04 NOTE — Assessment & Plan Note (Signed)
Well-controlled; watch BP at home or work a few times a week while on NSAID and let me know if going up

## 2016-07-05 NOTE — Assessment & Plan Note (Signed)
Refer to ortho.

## 2016-07-15 ENCOUNTER — Inpatient Hospital Stay: Payer: BLUE CROSS/BLUE SHIELD

## 2016-07-15 ENCOUNTER — Inpatient Hospital Stay: Payer: BLUE CROSS/BLUE SHIELD | Admitting: Oncology

## 2016-07-20 ENCOUNTER — Encounter: Payer: Self-pay | Admitting: Oncology

## 2016-07-23 ENCOUNTER — Inpatient Hospital Stay: Payer: BLUE CROSS/BLUE SHIELD

## 2016-07-23 ENCOUNTER — Inpatient Hospital Stay: Payer: BLUE CROSS/BLUE SHIELD | Admitting: Oncology

## 2016-08-30 ENCOUNTER — Encounter: Payer: Self-pay | Admitting: Family Medicine

## 2016-08-30 ENCOUNTER — Ambulatory Visit (INDEPENDENT_AMBULATORY_CARE_PROVIDER_SITE_OTHER): Payer: BLUE CROSS/BLUE SHIELD | Admitting: Family Medicine

## 2016-08-30 DIAGNOSIS — I10 Essential (primary) hypertension: Secondary | ICD-10-CM | POA: Diagnosis not present

## 2016-08-30 DIAGNOSIS — G43109 Migraine with aura, not intractable, without status migrainosus: Secondary | ICD-10-CM

## 2016-08-30 DIAGNOSIS — J45909 Unspecified asthma, uncomplicated: Secondary | ICD-10-CM

## 2016-08-30 MED ORDER — ALBUTEROL SULFATE HFA 108 (90 BASE) MCG/ACT IN AERS: 2.0000 | INHALATION_SPRAY | RESPIRATORY_TRACT | 1 refills | Status: DC | PRN

## 2016-08-30 MED ORDER — PROPRANOLOL HCL ER 60 MG PO CP24: 60.0000 mg | ORAL_CAPSULE | Freq: Every day | ORAL | 5 refills | Status: DC

## 2016-08-30 MED ORDER — SUMATRIPTAN SUCCINATE 100 MG PO TABS: ORAL_TABLET | ORAL | 2 refills | Status: DC

## 2016-08-30 NOTE — Progress Notes (Signed)
BP 124/76 (BP Location: Left Arm, Patient Position: Sitting, Cuff Size: Large)   Pulse 87   Temp 98.5 F (36.9 C) (Oral)   Resp 18   Ht _0  (1.727 m)   Wt 264 lb 7 oz (119.9 kg)   SpO2 94%   BMI 40.21 kg/m    Subjective:    Patient ID: Tanner Ryan., male    DOB: February 15, 1971, 45 y.o.   MRN: [id number]  HPI: Tanner Ryan is a 45 y.o. male  Chief Complaint  Patient presents with  . Medication Refill    Headache medications not working; have to take 3 in order to work    He is here for f/u of headaches He is taking a Maxalt at the start of migraine, and then one more two hours later and then another Maxalt four hours after that, so three pills in a 12 hour period; he has only taken 3 pills together like that twice; no chest pain, just felt like a strung out zombie; no stroke-like symptoms; I immediately cautioned him that the max is 2 per 24 hours; do NOT take 3 in a day, risk of stroke, heart attack; he agreed Triggers include strong perfumes Staying hydrated  He needs refills of inhalers; asthma; very rare use of SABA  His anemia is doing better; hematologist did labs in August Lab Results  Component Value Date   WBC 6.3 07/04/2016   HGB 15.1 07/04/2016   HCT 45.6 07/04/2016   MCV 88.5 07/04/2016   PLT 232 07/04/2016    Depression screen PHQ 2/9 08/30/2016 07/04/2016 04/02/2016  Decreased Interest 0 0 0  Down, Depressed, Hopeless 0 0 0  PHQ - 2 Score 0 0 0   Relevant past medical, surgical, family and social history reviewed Past Medical History:  Diagnosis Date  . Allergic rhinitis   . Allergy   . Anemia iron deficiency  . Anxiety   . Arthritis   . Asthma   . Chronic pain of left elbow   . Dysrhythmia   . GERD (gastroesophageal reflux disease)   . Headache   . Hypertension   . Obesity   . RLS (restless legs syndrome)   . Shortness of breath dyspnea   . Sleep apnea    Past Surgical History:  Procedure Laterality Date  . CHOLECYSTECTOMY N/A  07/25/2015   Procedure: LAPAROSCOPIC CHOLECYSTECTOMY WITH INTRAOPERATIVE CHOLANGIOGRAM;  Surgeon: Florene Glen, MD;  Location: [facility] ORS;  Service: General;  Laterality: N/A;  . COLONOSCOPY WITH ESOPHAGOGASTRODUODENOSCOPY (EGD)  04/28/2014  . CRANIOTOMY  2003  . ELBOW FRACTURE SURGERY  2007   suffered from [place] and had to undo surgical procedure  . TONSILLECTOMY AND ADENOIDECTOMY  1978   Family History  Problem Relation Age of Onset  . Osteoporosis Mother 41  . Heart attack Father 32  . Heart disease Father    Social History  Substance Use Topics  . Smoking status: Never Smoker  . Smokeless tobacco: Never Used  . Alcohol use Yes     Comment: Occasional   Interim medical history since last visit reviewed. Allergies and medications reviewed  Review of Systems Per HPI unless specifically indicated above     Objective:    BP 124/76 (BP Location: Left Arm, Patient Position: Sitting, Cuff Size: Large)   Pulse 87   Temp 98.5 F (36.9 C) (Oral)   Resp 18   Ht _1  (1.727 m)   Wt 264 lb 7 oz (  119.9 kg)   SpO2 94%   BMI 40.21 kg/m   Wt Readings from Last 3 Encounters:  08/30/16 264 lb 7 oz (119.9 kg)  07/04/16 263 lb (119.3 kg)  04/02/16 267 lb (121.1 kg)    Physical Exam  Constitutional: He appears well-developed and well-nourished. He does not appear ill. No distress.  Weight stable since last visit; morbidly obese  HENT:  Mouth/Throat: Mucous membranes are normal.  Eyes: Right eye exhibits no discharge. Left eye exhibits no discharge. Right conjunctiva is not injected. Left conjunctiva is not injected.  Cardiovascular: Normal rate and regular rhythm.   Pulmonary/Chest: Effort normal and breath sounds normal.  Broad, barrel-chested  Abdominal: He exhibits no distension.  Musculoskeletal: He exhibits no edema.  Lymphadenopathy:    He has no cervical adenopathy.  Skin: Skin is warm, dry and intact. He is not diaphoretic. No pallor.  Psychiatric: He has a normal  mood and affect. His behavior is normal. Judgment and thought content normal.   Results for orders placed or performed in visit on 07/04/16  CBC with Differential/Platelet  Result Value Ref Range   WBC 6.3 3.8 - 10.8 K/uL   RBC 5.15 4.20 - 5.80 MIL/uL   Hemoglobin 15.1 13.2 - 17.1 g/dL   HCT 45.6 38.5 - 50.0 %   MCV 88.5 80.0 - 100.0 fL   MCH 29.3 27.0 - 33.0 pg   MCHC 33.1 32.0 - 36.0 g/dL   RDW 13.8 11.0 - 15.0 %   Platelets 232 140 - 400 K/uL   MPV 11.6 7.5 - 12.5 fL   Neutro Abs 3,654 1,500 - 7,800 cells/uL   Lymphs Abs 1,701 850 - 3,900 cells/uL   Monocytes Absolute 693 200 - 950 cells/uL   Eosinophils Absolute 189 15 - 500 cells/uL   Basophils Absolute 63 0 - 200 cells/uL   Neutrophils Relative % 58 %   Lymphocytes Relative 27 %   Monocytes Relative 11 %   Eosinophils Relative 3 %   Basophils Relative 1 %   Smear Review Criteria for review not met   Ferritin  Result Value Ref Range   Ferritin 34 20 - 380 ng/mL  Comprehensive metabolic panel  Result Value Ref Range   Sodium 142 135 - 146 mmol/L   Potassium 4.3 3.5 - 5.3 mmol/L   Chloride 101 98 - 110 mmol/L   CO2 33 (H) 20 - 31 mmol/L   Glucose, Bld 84 65 - 99 mg/dL   BUN 7 7 - 25 mg/dL   Creat 0.98 0.60 - 1.35 mg/dL   Total Bilirubin 0.6 0.2 - 1.2 mg/dL   Alkaline Phosphatase 75 40 - 115 U/L   AST 20 10 - 40 U/L   ALT 20 9 - 46 U/L   Total Protein 6.7 6.1 - 8.1 g/dL   Albumin 4.1 3.6 - 5.1 g/dL   Calcium 9.7 8.6 - 10.3 mg/dL  Lipid panel  Result Value Ref Range   Cholesterol 171 125 - 200 mg/dL   Triglycerides 66 <150 mg/dL   HDL 41 >=40 mg/dL   Total CHOL/HDL Ratio 4.2 <=5.0 Ratio   VLDL 13 <30 mg/dL   LDL Cholesterol 117 <130 mg/dL      Assessment & Plan:   Problem List Items Addressed This Visit      Cardiovascular and Mediastinum   Migraine    Will switch beta-blocker to propranolol; start magnesium supplementation; avoid triggers; explained that there is a max of two triptans per 24 hours, risk  of stroke, heart attack; he verbalized understanding; will switch triptan; if not improving, then refer to neuro; patient may keep headache diary to help with further management      Relevant Medications   propranolol ER (INDERAL LA) 60 MG 24 hr capsule   SUMAtriptan (IMITREX) 100 MG tablet   Benign hypertension    Will change beta-blocker to propranolol to get additional benefit for headache prevention; monitor BP and pulse; weight loss, DASH guidelines      Relevant Medications   propranolol ER (INDERAL LA) 60 MG 24 hr capsule     Respiratory   Asthma    Very intermittent; refilled inhaler; changing beta-blocker but using very low dose; call if worsening      Relevant Medications   albuterol (PROVENTIL HFA;VENTOLIN HFA) 108 (90 Base) MCG/ACT inhaler    Other Visit Diagnoses   None.      Follow up plan: No Follow-up on file.  An after-visit summary was printed and given to the patient at [place].  Please see the patient instructions which may contain other information and recommendations beyond what is mentioned above in the assessment and plan.  Meds ordered this encounter  Medications  . propranolol ER (INDERAL LA) 60 MG 24 hr capsule    Sig: Take 1 capsule (60 mg total) by mouth daily. For svt and migraine prevention    Dispense:  30 capsule    Refill:  5    STOP metoprolol  . SUMAtriptan (IMITREX) 100 MG tablet    Sig: One tablet by mouth at onset of migraine; may repeat in 2 hours if headache persists or recurs. Max of 2 per 24 hours    Dispense:  12 tablet    Refill:  2    STOP maxalt  . Magnesium Oxide 400 (240 Mg) MG TABS    Sig: Take 1 tablet by mouth daily.    Dispense:  30 tablet    Refill:  11  . albuterol (PROVENTIL HFA;VENTOLIN HFA) 108 (90 Base) MCG/ACT inhaler    Sig: Inhale 2 puffs into the lungs every 4 (four) hours as needed for wheezing or shortness of breath.    Dispense:  1 Inhaler    Refill:  1    No orders of the defined types were  placed in this encounter.

## 2016-08-30 NOTE — Patient Instructions (Signed)
STOP metoprolol (Toprol) START propranolol for migraine prophylaxis and to help prevent PSVT STOP the Maxalt START the sumatriptan if needed for migraines START the magnesium oxide 400 mg daily to help with headache prevention Keep a headache diary and let me know if you'd like to see a headache specialist if not improving

## 2016-08-31 NOTE — Assessment & Plan Note (Signed)
Will switch beta-blocker to propranolol; start magnesium supplementation; avoid triggers; explained that there is a max of two triptans per 24 hours, risk of stroke, heart attack; he verbalized understanding; will switch triptan; if not improving, then refer to neuro; patient may keep headache diary to help with further management

## 2016-08-31 NOTE — Assessment & Plan Note (Addendum)
Very intermittent; refilled inhaler; changing beta-blocker but using very low dose; call if worsening

## 2016-08-31 NOTE — Assessment & Plan Note (Signed)
Will change beta-blocker to propranolol to get additional benefit for headache prevention; monitor BP and pulse; weight loss, DASH guidelines

## 2016-10-30 NOTE — Progress Notes (Signed)
Tanner Ryan  [phone] [phone]  ID: [name] OB: 09-24-1971  MR#: [id number]  [id number]  Patient Care Team: Arnetha Courser, MD as PCP - General (Family Medicine)  CHIEF COMPLAINT: Iron deficiency anemia.  INTERVAL HISTORY: Patient is a 45 year old male whose last evaluated in clinic in January 2017. He returns to clinic today for repeat laboratory work and further evaluation. He complains of chronic weakness and fatigue, but otherwise feels well. He is no neurologic complaints. He denies any recent fevers or illnesses. He has a good appetite and denies weight loss. He has no chest pain or shortness of breath. He denies any nausea, vomiting, constipation, or diarrhea. He denies any melena or hematochezia. He has no urinary complaints. Patient offers no further specific complaints today.  REVIEW OF SYSTEMS:   Review of Systems  Constitutional: Positive for malaise/fatigue. Negative for fever and weight loss.  Respiratory: Negative.  Negative for cough and shortness of breath.   Cardiovascular: Negative.  Negative for chest pain and leg swelling.  Gastrointestinal: Negative.  Negative for abdominal pain, blood in stool and melena.  Genitourinary: Negative.   Neurological: Positive for weakness.  Psychiatric/Behavioral: The patient is not nervous/anxious.     As per HPI. Otherwise, a complete review of systems is negative.  PAST MEDICAL HISTORY: Past Medical History:  Diagnosis Date  . Allergic rhinitis   . Allergy   . Anemia iron deficiency  . Anxiety   . Arthritis   . Asthma   . Chronic pain of left elbow   . Dysrhythmia   . GERD (gastroesophageal reflux disease)   . Headache   . Hypertension   . Obesity   . RLS (restless legs syndrome)   . Shortness of breath dyspnea   . Sleep apnea     PAST SURGICAL HISTORY: Past Surgical History:  Procedure Laterality Date  . CHOLECYSTECTOMY N/A 07/25/2015   Procedure:  LAPAROSCOPIC CHOLECYSTECTOMY WITH INTRAOPERATIVE CHOLANGIOGRAM;  Surgeon: Florene Glen, MD;  Location: [facility] ORS;  Service: General;  Laterality: N/A;  . COLONOSCOPY WITH ESOPHAGOGASTRODUODENOSCOPY (EGD)  04/28/2014  . CRANIOTOMY  2003  . ELBOW FRACTURE SURGERY  2007   suffered from [place] and had to undo surgical procedure  . TONSILLECTOMY AND ADENOIDECTOMY  1978    FAMILY HISTORY: Family History  Problem Relation Age of Onset  . Osteoporosis Mother 42  . Heart attack Father 66  . Heart disease Father     ADVANCED DIRECTIVES (Y/N):  N  HEALTH MAINTENANCE: Social History  Substance Use Topics  . Smoking status: Never Smoker  . Smokeless tobacco: Never Used  . Alcohol use Yes     Comment: Occasional     Colonoscopy:  PAP:  Bone density:  Lipid panel:  Allergies  Allergen Reactions  . Aspirin Other (See Comments)    GI issues  . Penicillins Nausea And Vomiting    Current Outpatient Prescriptions  Medication Sig Dispense Refill  . albuterol (PROVENTIL HFA;VENTOLIN HFA) 108 (90 Base) MCG/ACT inhaler Inhale 2 puffs into the lungs every 4 (four) hours as needed for wheezing or shortness of breath. 1 Inhaler 1  . buPROPion (WELLBUTRIN XL) 150 MG 24 hr tablet Take 1 tablet (150 mg total) by mouth daily. 30 tablet 5  . busPIRone (BUSPAR) 15 MG tablet Take 1 tablet (15 mg total) by mouth 2 (two) times daily as needed. 60 tablet 6  . celecoxib (CELEBREX) 100 MG capsule One by mouth once or twice a day  if needed for elbow pain 60 capsule 0  . hydrochlorothiazide (HYDRODIURIL) 25 MG tablet Take 1 tablet (25 mg total) by mouth daily. 30 tablet 6  . Magnesium Oxide 400 (240 Mg) MG TABS Take 1 tablet by mouth daily. 30 tablet 11  . montelukast (SINGULAIR) 10 MG tablet Take 1 tablet (10 mg total) by mouth daily. 30 tablet 11  . omeprazole (PRILOSEC) 20 MG capsule Take 20 mg by mouth as needed.     . pramipexole (MIRAPEX) 0.5 MG tablet Take 1 tablet (0.5 mg total) by mouth at  bedtime. 90 tablet 3  . propranolol ER (INDERAL LA) 60 MG 24 hr capsule Take 1 capsule (60 mg total) by mouth daily. For svt and migraine prevention 30 capsule 5  . SUMAtriptan (IMITREX) 100 MG tablet One tablet by mouth at onset of migraine; may repeat in 2 hours if headache persists or recurs. Max of 2 per 24 hours 12 tablet 2   No current facility-administered medications for this visit.     OBJECTIVE: Vitals:   10/31/16 1523  BP: (!) 151/97  Pulse: 71  Resp: 18  Temp: 98.4 F (36.9 C)     Body mass index is 39.91 kg/m.    ECOG FS:0 - Asymptomatic  General: Well-developed, well-nourished, no acute distress. Eyes: Pink conjunctiva, anicteric sclera. HEENT: Normocephalic, moist mucous membranes, clear oropharnyx. Lungs: Clear to auscultation bilaterally. Heart: Regular rate and rhythm. No rubs, murmurs, or gallops. Abdomen: Soft, nontender, nondistended. No organomegaly noted, normoactive bowel sounds. Musculoskeletal: No edema, cyanosis, or clubbing. Neuro: Alert, answering all questions appropriately. Cranial nerves grossly intact. Skin: No rashes or petechiae noted. Psych: Normal affect. Lymphatics: No cervical, calvicular, axillary or inguinal LAD.   LAB RESULTS:  Lab Results  Component Value Date   NA 142 07/04/2016   K 4.3 07/04/2016   CL 101 07/04/2016   CO2 33 (H) 07/04/2016   GLUCOSE 84 07/04/2016   BUN 7 07/04/2016   CREATININE 0.98 07/04/2016   CALCIUM 9.7 07/04/2016   PROT 6.7 07/04/2016   ALBUMIN 4.1 07/04/2016   AST 20 07/04/2016   ALT 20 07/04/2016   ALKPHOS 75 07/04/2016   BILITOT 0.6 07/04/2016   GFRNONAA >60 07/26/2015   GFRAA >60 07/26/2015    Lab Results  Component Value Date   WBC 7.7 10/31/2016   NEUTROABS 5.1 10/31/2016   HGB 16.0 10/31/2016   HCT 48.0 10/31/2016   MCV 89.2 10/31/2016   PLT 216 10/31/2016   Lab Results  Component Value Date   IRON 80 06/20/2015   TIBC 494 (H) 06/20/2015   IRONPCTSAT 16 (L) 06/20/2015   Lab  Results  Component Value Date   FERRITIN 53 10/31/2016     STUDIES: No results found.  ASSESSMENT: Iron deficiency anemia.  PLAN:    1. Iron deficiency anemia:  Patient's hemoglobin and ferritin continues to be well within normal limits. Previously, all of his other lab work is either negative or within normal limits. He has been instructed to continue his oral iron supplementation as directed. Patient's next scheduled colonoscopy is in 2018. No further intervention is needed. Patient does not require IV iron. After discussion with the patient, no further follow-up is necessary. Please refer patient back of his hemoglobin begins to trend down. 2. Hypertension: Patient's blood pressure is elevated today, continue monitoring and treatment per primary care.  Patient expressed understanding and was in agreement with this plan. He also understands that He can call clinic at any time with  any questions, concerns, or complaints.    Lloyd Huger, MD   11/04/2016 1:46 PM

## 2016-10-31 ENCOUNTER — Inpatient Hospital Stay: Payer: BLUE CROSS/BLUE SHIELD | Attending: Oncology

## 2016-10-31 ENCOUNTER — Inpatient Hospital Stay (HOSPITAL_BASED_OUTPATIENT_CLINIC_OR_DEPARTMENT_OTHER): Payer: BLUE CROSS/BLUE SHIELD | Admitting: Oncology

## 2016-10-31 VITALS — BP 151/97 | HR 71 | Temp 98.4°F | Resp 18 | Wt 262.5 lb

## 2016-10-31 DIAGNOSIS — R5383 Other fatigue: Secondary | ICD-10-CM | POA: Diagnosis not present

## 2016-10-31 DIAGNOSIS — J45909 Unspecified asthma, uncomplicated: Secondary | ICD-10-CM | POA: Diagnosis not present

## 2016-10-31 DIAGNOSIS — M129 Arthropathy, unspecified: Secondary | ICD-10-CM | POA: Insufficient documentation

## 2016-10-31 DIAGNOSIS — G4730 Sleep apnea, unspecified: Secondary | ICD-10-CM | POA: Diagnosis not present

## 2016-10-31 DIAGNOSIS — D509 Iron deficiency anemia, unspecified: Secondary | ICD-10-CM

## 2016-10-31 DIAGNOSIS — I10 Essential (primary) hypertension: Secondary | ICD-10-CM

## 2016-10-31 DIAGNOSIS — K219 Gastro-esophageal reflux disease without esophagitis: Secondary | ICD-10-CM

## 2016-10-31 DIAGNOSIS — Z9049 Acquired absence of other specified parts of digestive tract: Secondary | ICD-10-CM

## 2016-10-31 DIAGNOSIS — R0602 Shortness of breath: Secondary | ICD-10-CM

## 2016-10-31 DIAGNOSIS — R531 Weakness: Secondary | ICD-10-CM | POA: Insufficient documentation

## 2016-10-31 DIAGNOSIS — G2581 Restless legs syndrome: Secondary | ICD-10-CM | POA: Insufficient documentation

## 2016-10-31 DIAGNOSIS — E669 Obesity, unspecified: Secondary | ICD-10-CM | POA: Diagnosis not present

## 2016-10-31 DIAGNOSIS — Z79899 Other long term (current) drug therapy: Secondary | ICD-10-CM | POA: Diagnosis not present

## 2016-10-31 DIAGNOSIS — R51 Headache: Secondary | ICD-10-CM | POA: Insufficient documentation

## 2016-10-31 DIAGNOSIS — F419 Anxiety disorder, unspecified: Secondary | ICD-10-CM

## 2016-10-31 LAB — CBC WITH DIFFERENTIAL/PLATELET
BASOS PCT: 1 %
Basophils Absolute: 0.1 10*3/uL (ref 0–0.1)
EOS PCT: 2 %
Eosinophils Absolute: 0.2 10*3/uL (ref 0–0.7)
HEMATOCRIT: 48 % (ref 40.0–52.0)
Hemoglobin: 16 g/dL (ref 13.0–18.0)
Lymphocytes Relative: 22 %
Lymphs Abs: 1.7 10*3/uL (ref 1.0–3.6)
MCH: 29.8 pg (ref 26.0–34.0)
MCHC: 33.4 g/dL (ref 32.0–36.0)
MCV: 89.2 fL (ref 80.0–100.0)
MONO ABS: 0.7 10*3/uL (ref 0.2–1.0)
MONOS PCT: 9 %
NEUTROS ABS: 5.1 10*3/uL (ref 1.4–6.5)
Neutrophils Relative %: 66 %
Platelets: 216 10*3/uL (ref 150–440)
RBC: 5.38 MIL/uL (ref 4.40–5.90)
RDW: 14.1 % (ref 11.5–14.5)
WBC: 7.7 10*3/uL (ref 3.8–10.6)

## 2016-10-31 LAB — FERRITIN: Ferritin: 53 ng/mL (ref 24–336)

## 2016-10-31 NOTE — Progress Notes (Signed)
States is feeling tired and weak today. Feels like needs iron infusion.

## 2016-11-29 ENCOUNTER — Other Ambulatory Visit: Payer: Self-pay | Admitting: Family Medicine

## 2016-12-04 NOTE — Telephone Encounter (Signed)
Glitch in the system; I was not getting electronic refill requests from Dec 13th until yesterday; addressed with IT; handling refill requests now --------------------- rxs approved

## 2016-12-06 ENCOUNTER — Other Ambulatory Visit: Payer: Self-pay | Admitting: Family Medicine

## 2016-12-09 ENCOUNTER — Encounter: Payer: Self-pay | Admitting: Family Medicine

## 2016-12-09 MED ORDER — MONTELUKAST SODIUM 10 MG PO TABS: 10.0000 mg | ORAL_TABLET | Freq: Every day | ORAL | 11 refills | Status: DC

## 2017-02-27 ENCOUNTER — Other Ambulatory Visit: Payer: Self-pay | Admitting: Family Medicine

## 2017-03-20 ENCOUNTER — Ambulatory Visit: Payer: Self-pay | Admitting: Family Medicine

## 2017-03-28 ENCOUNTER — Encounter: Payer: Self-pay | Admitting: Family Medicine

## 2017-03-28 ENCOUNTER — Ambulatory Visit (INDEPENDENT_AMBULATORY_CARE_PROVIDER_SITE_OTHER): Payer: BLUE CROSS/BLUE SHIELD | Admitting: Family Medicine

## 2017-03-28 VITALS — BP 130/84 | HR 76 | Temp 97.6°F | Resp 14 | Wt 255.7 lb

## 2017-03-28 DIAGNOSIS — E6609 Other obesity due to excess calories: Secondary | ICD-10-CM

## 2017-03-28 DIAGNOSIS — H6122 Impacted cerumen, left ear: Secondary | ICD-10-CM | POA: Diagnosis not present

## 2017-03-28 DIAGNOSIS — J0100 Acute maxillary sinusitis, unspecified: Secondary | ICD-10-CM

## 2017-03-28 DIAGNOSIS — Z6839 Body mass index (BMI) 39.0-39.9, adult: Secondary | ICD-10-CM | POA: Diagnosis not present

## 2017-03-28 DIAGNOSIS — M25562 Pain in left knee: Secondary | ICD-10-CM

## 2017-03-28 DIAGNOSIS — M25561 Pain in right knee: Secondary | ICD-10-CM | POA: Diagnosis not present

## 2017-03-28 MED ORDER — DOXYCYCLINE HYCLATE 100 MG PO TABS: 100.0000 mg | ORAL_TABLET | Freq: Two times a day (BID) | ORAL | 0 refills | Status: DC

## 2017-03-28 NOTE — Progress Notes (Signed)
BP 130/84   Pulse 76   Temp 97.6 F (36.4 C) (Oral)   Resp 14   Wt 255 lb 11.2 oz (116 kg)   SpO2 93%   BMI 38.88 kg/m    Subjective:    Patient ID: Tanner Quan., male    DOB: 05-20-1971, 46 y.o.   MRN: [id number]  HPI: Tanner Ryan is a 46 y.o. male  Chief Complaint  Patient presents with  . Allergies    whole left side of face hurting    HPI Left side of the face is bothering him; pressure on the left side of the face; left ear popping a lot Pain along teeth Not blowing much out, all clogged up; sometimes does run like a faucet No fevers Took some sudafed No recent antibiotics Going on one week Has allergies anyway, this is above and beyond regular allergies  About two years, tested for arthritis in the knee; here lately, feels like he is 46 years old walking up stairs Right knee more than left, both can be affected Pops in the morning; creaking; worse going up stairs; not tried anything Doing more exercises Tried extra strength tylenol and that helps Family hx of arthritis  I asked about dark mole on left temple, left zygoma; he says it has been there for years, has not changed, has not gotten darker, has not grown  Depression screen [facility] 2/9 03/28/2017 08/30/2016 07/04/2016 04/02/2016  Decreased Interest 0 0 0 0  Down, Depressed, Hopeless 0 0 0 0  PHQ - 2 Score 0 0 0 0   Relevant past medical, surgical, family and social history reviewed Past Medical History:  Diagnosis Date  . Allergic rhinitis   . Allergy   . Anemia iron deficiency  . Anxiety   . Arthritis   . Asthma   . Chronic pain of left elbow   . Dysrhythmia   . GERD (gastroesophageal reflux disease)   . Headache   . Hypertension   . Obese 05/22/2015  . Obesity   . RLS (restless legs syndrome)   . Shortness of breath dyspnea   . Sleep apnea    Past Surgical History:  Procedure Laterality Date  . CHOLECYSTECTOMY N/A 07/25/2015   Procedure: LAPAROSCOPIC CHOLECYSTECTOMY WITH  INTRAOPERATIVE CHOLANGIOGRAM;  Surgeon: Florene Glen, MD;  Location: [facility] ORS;  Service: General;  Laterality: N/A;  . COLONOSCOPY WITH ESOPHAGOGASTRODUODENOSCOPY (EGD)  04/28/2014  . CRANIOTOMY  2003  . ELBOW FRACTURE SURGERY  2007   suffered from [place] and had to undo surgical procedure  . TONSILLECTOMY AND ADENOIDECTOMY  1978   Family History  Problem Relation Age of Onset  . Osteoporosis Mother 19  . Heart attack Father 71  . Heart disease Father   . Kidney disease Father   . Drug abuse Sister    Social History  Substance Use Topics  . Smoking status: Never Smoker  . Smokeless tobacco: Never Used  . Alcohol use Yes     Comment: Occasional    Interim medical history since last visit reviewed. Allergies and medications reviewed  Review of Systems Per HPI unless specifically indicated above Weight loss noted; using fit bit; walking so much more    Objective:    BP 130/84   Pulse 76   Temp 97.6 F (36.4 C) (Oral)   Resp 14   Wt 255 lb 11.2 oz (116 kg)   SpO2 93%   BMI 38.88 kg/m   Wt Readings from  Last 3 Encounters:  03/28/17 255 lb 11.2 oz (116 kg)  10/31/16 262 lb 7.3 oz (119 kg)  08/30/16 264 lb 7 oz (119.9 kg)    Physical Exam  Constitutional: He appears well-developed and well-nourished. He does not appear ill. No distress.  Weight lost noted since last visit, acknowledged  HENT:  Right Ear: Tympanic membrane, external ear and ear canal normal.  Left Ear: External ear normal.  Nose: Mucosal edema (left side) and rhinorrhea (left side) present. Right sinus exhibits no maxillary sinus tenderness and no frontal sinus tenderness. Left sinus exhibits maxillary sinus tenderness. Left sinus exhibits no frontal sinus tenderness.  Mouth/Throat: Oropharynx is clear and moist and mucous membranes are normal.  Impacted cerumen removed in several pieces with curette under direct visualization by MD; tolerated well by patient, TM on the left with some cloudy fluid  behind the drum; no erythem  Eyes: Right eye exhibits no discharge. Left eye exhibits no discharge. Right conjunctiva is not injected. Left conjunctiva is not injected.  Cardiovascular: Normal rate and regular rhythm.   Pulmonary/Chest: Effort normal and breath sounds normal.  Broad, barrel-chested  Abdominal: He exhibits no distension.  Musculoskeletal: He exhibits no edema.       Right knee: He exhibits no swelling and no effusion. No tenderness found.       Left knee: He exhibits no swelling and no effusion. No tenderness found.  Crepitus of both knees with active flexion and extension  Lymphadenopathy:    He has no cervical adenopathy.  Skin: Skin is warm, dry and intact. He is not diaphoretic. No pallor.  Dark brown mostly macular lesion on the LEFT temple/zygoma, surfaces appears slightly keratotic under magnification  Psychiatric: He has a normal mood and affect. His mood appears not anxious. He does not exhibit a depressed mood.    Results for orders placed or performed in visit on 10/31/16  CBC with Differential  Result Value Ref Range   WBC 7.7 3.8 - 10.6 K/uL   RBC 5.38 4.40 - 5.90 MIL/uL   Hemoglobin 16.0 13.0 - 18.0 g/dL   HCT 48.0 40.0 - 52.0 %   MCV 89.2 80.0 - 100.0 fL   MCH 29.8 26.0 - 34.0 pg   MCHC 33.4 32.0 - 36.0 g/dL   RDW 14.1 11.5 - 14.5 %   Platelets 216 150 - 440 K/uL   Neutrophils Relative % 66 %   Neutro Abs 5.1 1.4 - 6.5 K/uL   Lymphocytes Relative 22 %   Lymphs Abs 1.7 1.0 - 3.6 K/uL   Monocytes Relative 9 %   Monocytes Absolute 0.7 0.2 - 1.0 K/uL   Eosinophils Relative 2 %   Eosinophils Absolute 0.2 0 - 0.7 K/uL   Basophils Relative 1 %   Basophils Absolute 0.1 0 - 0.1 K/uL  Ferritin  Result Value Ref Range   Ferritin 53 24 - 336 ng/mL      Assessment & Plan:   Problem List Items Addressed This Visit      Other   Obese    Praised patient for his weight loss efforts      Arthralgia of both knees    Likely osteoarthritis; not so bad  to see specialist right now; work on weight loss, acetaminophen per package directions, regular walking; celebrex       Other Visit Diagnoses    Acute non-recurrent maxillary sinusitis    -  Primary   discussed diagnosis; start antibiotics; PCN-allergic; see AVS  Relevant Medications   fluticasone (FLONASE) 50 MCG/ACT nasal spray   doxycycline (VIBRA-TABS) 100 MG tablet   Impacted cerumen of left ear       removed impacted cerumen with curette under direct visualization, by MD; patient tolerated well       Follow up plan: No Follow-up on file.  An after-visit summary was printed and given to the patient at [place].  Please see the patient instructions which may contain other information and recommendations beyond what is mentioned above in the assessment and plan.  Meds ordered this encounter  Medications  . fluticasone (FLONASE) 50 MCG/ACT nasal spray    Sig: Place 2 sprays into the nose daily.  . DUREZOL 0.05 % EMUL  . prednisoLONE acetate (PRED FORTE) 1 % ophthalmic suspension  . doxycycline (VIBRA-TABS) 100 MG tablet    Sig: Take 1 tablet (100 mg total) by mouth 2 (two) times daily.    Dispense:  20 tablet    Refill:  0    No orders of the defined types were placed in this encounter.

## 2017-03-28 NOTE — Assessment & Plan Note (Signed)
Praised patient for his weight loss efforts

## 2017-03-28 NOTE — Patient Instructions (Signed)
Start the antibiotics Please do eat yogurt daily or take a probiotic daily for the next month We want to replace the healthy germs in the gut If you notice foul, watery diarrhea in the next two months, schedule an appointment RIGHT AWAY

## 2017-03-28 NOTE — Assessment & Plan Note (Signed)
Likely osteoarthritis; not so bad to see specialist right now; work on weight loss, acetaminophen per package directions, regular walking; celebrex

## 2017-04-04 ENCOUNTER — Encounter: Payer: BLUE CROSS/BLUE SHIELD | Admitting: Family Medicine

## 2017-05-23 ENCOUNTER — Ambulatory Visit: Payer: Self-pay | Admitting: Family Medicine

## 2017-05-26 ENCOUNTER — Other Ambulatory Visit: Payer: Self-pay

## 2017-05-26 NOTE — Telephone Encounter (Signed)
Okay to send a 30 day prescription for hydrochlorothiazide and then he should follow up with PCP for BP management

## 2017-05-26 NOTE — Telephone Encounter (Signed)
Pt asking for a new RX sent to [facility] in [place], [place]. Pt is transferring pharmacies and is  out of medication . Last labs shown end of last year. Recent visit was in April 2018 for an acute visit.

## 2017-05-27 ENCOUNTER — Other Ambulatory Visit: Payer: Self-pay | Admitting: Family Medicine

## 2017-05-27 NOTE — Telephone Encounter (Signed)
Called [facility] in [place], [place] to do a verbal. The pharmacist stated [facility] [place], [place] # [id number]. Called the pt to make sure which pharmacy. No answer left a voicemail to call me back.

## 2017-05-29 ENCOUNTER — Other Ambulatory Visit: Payer: Self-pay

## 2017-05-29 MED ORDER — PRAMIPEXOLE DIHYDROCHLORIDE 0.5 MG PO TABS: 0.5000 mg | ORAL_TABLET | Freq: Every day | ORAL | 0 refills | Status: DC

## 2017-05-29 NOTE — Telephone Encounter (Signed)
Spoke to [facility]ist [facility] [place] and she stated the Refill Rx was suppose to go to them. I mention  to her that I was waiting for the pt to call me back  but since she confirm that they were the ones that suppose to go to I went ahead and did a verbal for Hydrodiuril 25 mg tablet 30 QTY and 0 refills as it was approved by Dr. Manuella Ghazi. Pharmacist also made sure she put on a note in that he need to call and schedule a follow up with Dr. lada.

## 2017-05-30 ENCOUNTER — Telehealth: Payer: Self-pay

## 2017-05-30 NOTE — Telephone Encounter (Signed)
Spoke with pt. Pt states the medicine after today [facility] [place], [place]. Pt states he discuss with his wife the [facility] [place] [facility] to the house. Also deleted the two local [place] [facility] as request by the pt due to not living in [place] any more

## 2017-06-06 ENCOUNTER — Ambulatory Visit (INDEPENDENT_AMBULATORY_CARE_PROVIDER_SITE_OTHER): Payer: BLUE CROSS/BLUE SHIELD | Admitting: Family Medicine

## 2017-06-06 ENCOUNTER — Encounter: Payer: Self-pay | Admitting: Family Medicine

## 2017-06-06 VITALS — BP 132/86 | HR 89 | Temp 98.4°F | Resp 14 | Ht 68.0 in | Wt 262.1 lb

## 2017-06-06 DIAGNOSIS — J3089 Other allergic rhinitis: Secondary | ICD-10-CM | POA: Diagnosis not present

## 2017-06-06 DIAGNOSIS — Z114 Encounter for screening for human immunodeficiency virus [HIV]: Secondary | ICD-10-CM | POA: Diagnosis not present

## 2017-06-06 DIAGNOSIS — M25562 Pain in left knee: Secondary | ICD-10-CM | POA: Diagnosis not present

## 2017-06-06 DIAGNOSIS — G43109 Migraine with aura, not intractable, without status migrainosus: Secondary | ICD-10-CM | POA: Diagnosis not present

## 2017-06-06 DIAGNOSIS — Z6839 Body mass index (BMI) 39.0-39.9, adult: Secondary | ICD-10-CM

## 2017-06-06 DIAGNOSIS — D509 Iron deficiency anemia, unspecified: Secondary | ICD-10-CM

## 2017-06-06 DIAGNOSIS — E6609 Other obesity due to excess calories: Secondary | ICD-10-CM | POA: Diagnosis not present

## 2017-06-06 DIAGNOSIS — J4520 Mild intermittent asthma, uncomplicated: Secondary | ICD-10-CM

## 2017-06-06 DIAGNOSIS — M25561 Pain in right knee: Secondary | ICD-10-CM | POA: Diagnosis not present

## 2017-06-06 MED ORDER — FLUTICASONE PROPIONATE HFA 110 MCG/ACT IN AERO: 2.0000 | INHALATION_SPRAY | Freq: Two times a day (BID) | RESPIRATORY_TRACT | 12 refills | Status: DC

## 2017-06-06 MED ORDER — ALBUTEROL SULFATE HFA 108 (90 BASE) MCG/ACT IN AERS: 2.0000 | INHALATION_SPRAY | RESPIRATORY_TRACT | 2 refills | Status: AC | PRN

## 2017-06-06 MED ORDER — PROMETHAZINE HCL 12.5 MG PO TABS: 12.5000 mg | ORAL_TABLET | Freq: Four times a day (QID) | ORAL | 1 refills | Status: DC | PRN

## 2017-06-06 NOTE — Progress Notes (Signed)
BP 132/86   Pulse 89   Temp 98.4 F (36.9 C) (Oral)   Resp 14   Ht 5\' 8"  (1.727 m)   Wt 262 lb 1.6 oz (118.9 kg)   SpO2 95%   BMI 39.85 kg/m    Subjective:    Patient ID: Tanner Quan., male    DOB: [birth year], 46 y.o.   MRN: [id number]  HPI: Tanner Ryan is a 46 y.o. male  Chief Complaint  Patient presents with  . Medication Refill    inhaler-asthma    HPI Patient is here for routine f/u Asthma has been worse with hot humid weather; using rescue inhaler maybe every other day; first part of spring is a bad time too; fall is okay; taking singulair Nasal spray; not part of recall, can feel it and it's working On HCTZ for BP as well as beta-blocker; just checking BP occasionally; sometimes high, 150/100; from not sleeping; has sleep apnea, not using CPAP; will have it rechecked Prednisone eye drops, rxd by ophthalmologist He asked if he can get a Rx for zofran for nausea associated with sinus drainage Asked about tylenol for arthritis; discussed limits; knees bother him; worse when working several days in a row;  Obesity; no thyroid cancer, no fam hx of MEN-2 when discussing medicine options including Saxenda  Depression screen [facility] 2/9 06/06/2017 03/28/2017 08/30/2016 07/04/2016 04/02/2016  Decreased Interest 0 0 0 0 0  Down, Depressed, Hopeless 0 0 0 0 0  PHQ - 2 Score 0 0 0 0 0    Relevant past medical, surgical, family and social history reviewed Past Medical History:  Diagnosis Date  . Allergic rhinitis   . Allergy   . Anemia iron deficiency  . Anxiety   . Arthritis   . Asthma   . Chronic pain of left elbow   . Dysrhythmia   . GERD (gastroesophageal reflux disease)   . Headache   . Hypertension   . Obese 05/22/2015  . Obesity   . RLS (restless legs syndrome)   . Shortness of breath dyspnea   . Sleep apnea    Past Surgical History:  Procedure Laterality Date  . CHOLECYSTECTOMY N/A 07/25/2015   Procedure: LAPAROSCOPIC CHOLECYSTECTOMY WITH  INTRAOPERATIVE CHOLANGIOGRAM;  Surgeon: Florene Glen, MD;  Location: [facility] ORS;  Service: General;  Laterality: N/A;  . COLONOSCOPY WITH ESOPHAGOGASTRODUODENOSCOPY (EGD)  04/28/2014  . CRANIOTOMY  2003  . ELBOW FRACTURE SURGERY  2007   suffered from [place] and had to undo surgical procedure  . TONSILLECTOMY AND ADENOIDECTOMY  1978   Family History  Problem Relation Age of Onset  . Osteoporosis Mother 59  . Heart attack Father 64  . Heart disease Father   . Kidney disease Father   . Drug abuse Sister    Social History   Social History  . Marital status: Divorced    Spouse name: N/A  . Number of children: N/A  . Years of education: N/A   Occupational History  . Not on file.   Social History Main Topics  . Smoking status: Never Smoker  . Smokeless tobacco: Former Systems developer    Types: Chew  . Alcohol use Yes     Comment: Occasional  . Drug use: No  . Sexual activity: Not Currently   Other Topics Concern  . Not on file   Social History Narrative  . No narrative on file    Interim medical history since last visit reviewed. Allergies and medications  reviewed  Review of Systems Per HPI unless specifically indicated above     Objective:    BP 132/86   Pulse 89   Temp 98.4 F (36.9 C) (Oral)   Resp 14   Ht 5\' 8"  (1.727 m)   Wt 262 lb 1.6 oz (118.9 kg)   SpO2 95%   BMI 39.85 kg/m   Wt Readings from Last 3 Encounters:  06/06/17 262 lb 1.6 oz (118.9 kg)  03/28/17 255 lb 11.2 oz (116 kg)  10/31/16 262 lb 7.3 oz (119 kg)    Physical Exam  Constitutional: He appears well-developed and well-nourished. He does not appear ill. No distress.  obese  HENT:  Right Ear: Tympanic membrane, external ear and ear canal normal.  Left Ear: External ear normal.  Nose: Mucosal edema: left side. Rhinorrhea: left side.  Mouth/Throat: Oropharynx is clear and moist and mucous membranes are normal.  Eyes: Right eye exhibits no discharge. Left eye exhibits no discharge. Right  conjunctiva is not injected. Left conjunctiva is not injected.  Neck:  Surgical scar posterior neck/lower scalp  Cardiovascular: Normal rate and regular rhythm.   Pulmonary/Chest: Effort normal and breath sounds normal.  Broad, barrel-chested  Abdominal: He exhibits no distension.  Musculoskeletal: He exhibits no edema.       Right knee: He exhibits no swelling and no effusion. No tenderness found.       Left knee: He exhibits no swelling and no effusion. No tenderness found.  Crepitus of both knees with active flexion and extension  Lymphadenopathy:    He has no cervical adenopathy.  Skin: Skin is warm, dry and intact. He is not diaphoretic. No pallor.  Psychiatric: He has a normal mood and affect. His mood appears not anxious. He does not exhibit a depressed mood.    Results for orders placed or performed in visit on 10/31/16  CBC with Differential  Result Value Ref Range   WBC 7.7 3.8 - 10.6 K/uL   RBC 5.38 4.40 - 5.90 MIL/uL   Hemoglobin 16.0 13.0 - 18.0 g/dL   HCT 48.0 40.0 - 52.0 %   MCV 89.2 80.0 - 100.0 fL   MCH 29.8 26.0 - 34.0 pg   MCHC 33.4 32.0 - 36.0 g/dL   RDW 14.1 11.5 - 14.5 %   Platelets 216 150 - 440 K/uL   Neutrophils Relative % 66 %   Neutro Abs 5.1 1.4 - 6.5 K/uL   Lymphocytes Relative 22 %   Lymphs Abs 1.7 1.0 - 3.6 K/uL   Monocytes Relative 9 %   Monocytes Absolute 0.7 0.2 - 1.0 K/uL   Eosinophils Relative 2 %   Eosinophils Absolute 0.2 0 - 0.7 K/uL   Basophils Relative 1 %   Basophils Absolute 0.1 0 - 0.1 K/uL  Ferritin  Result Value Ref Range   Ferritin 53 24 - 336 ng/mL      Assessment & Plan:   Problem List Items Addressed This Visit      Cardiovascular and Mediastinum   Migraine    rx for phenergan provided to use if needed with migraines        Respiratory   Asthma - Primary    refills provided of asthma medicines      Relevant Medications   fluticasone (FLOVENT HFA) 110 MCG/ACT inhaler   albuterol (PROVENTIL HFA;VENTOLIN HFA)  108 (90 Base) MCG/ACT inhaler   Allergic rhinitis    Continue to avoid triggers; continue meds  Other   Obese    Encouragement given for weight loss; discussed different medication options, bariatric surgery available; see AVS      Iron deficiency anemia    Monitored by heme-onc      Arthralgia of both knees    Will use celebrex since he has hx of iron deficiency anemia and GERD       Other Visit Diagnoses    Screening for HIV (human immunodeficiency virus)       Relevant Orders   HIV antibody (with reflex)       Follow up plan: No Follow-up on file.  An after-visit summary was printed and given to the patient at [facility].  Please see the patient instructions which may contain other information and recommendations beyond what is mentioned above in the assessment and plan.  Meds ordered this encounter  Medications  . fluticasone (FLOVENT HFA) 110 MCG/ACT inhaler    Sig: Inhale 2 puffs into the lungs 2 (two) times daily. Use during spring and summer    Dispense:  1 Inhaler    Refill:  12  . albuterol (PROVENTIL HFA;VENTOLIN HFA) 108 (90 Base) MCG/ACT inhaler    Sig: Inhale 2 puffs into the lungs every 4 (four) hours as needed for wheezing or shortness of breath.    Dispense:  1 Inhaler    Refill:  2  . promethazine (PHENERGAN) 12.5 MG tablet    Sig: Take 1 tablet (12.5 mg total) by mouth every 6 (six) hours as needed for nausea. (be aware of sedation)    Dispense:  20 tablet    Refill:  1    Orders Placed This Encounter  Procedures  . HIV antibody (with reflex)

## 2017-06-06 NOTE — Patient Instructions (Addendum)
Start the new daily controlled inhaler; rinse mouth out after 2nd puff each time Use that during spring and summer Try turmeric as a natural anti-inflammatory (for pain and arthritis). It comes in capsules where you buy aspirin and fish oil, but also as a spice where you buy pepper and garlic powder. Read about Saxenda Read about Contrave Also, Tanner Ryan is available Find out which is covered by your insurance Check out the information at familydoctor.org entitled "Nutrition for Weight Loss: What You Need to Know about Fad Diets" Try to lose between 1-2 pounds per week by taking in fewer calories and burning off more calories You can succeed by limiting portions, limiting foods dense in calories and fat, becoming more active, and drinking 8 glasses of water a day (64 ounces) Don't skip meals, especially breakfast, as skipping meals may alter your metabolism Do not use over-the-counter weight loss pills or gimmicks that claim rapid weight loss A healthy BMI (or body mass index) is between 18.5 and 24.9 You can calculate your ideal BMI at the [place] website [employer]fr

## 2017-06-12 NOTE — Assessment & Plan Note (Signed)
Monitored by heme-onc 

## 2017-06-12 NOTE — Assessment & Plan Note (Signed)
refills provided of asthma medicines

## 2017-06-12 NOTE — Assessment & Plan Note (Signed)
Encouragement given for weight loss; discussed different medication options, bariatric surgery available; see AVS

## 2017-06-12 NOTE — Assessment & Plan Note (Signed)
Continue to avoid triggers; continue meds

## 2017-06-12 NOTE — Assessment & Plan Note (Signed)
rx for phenergan provided to use if needed with migraines

## 2017-06-12 NOTE — Assessment & Plan Note (Signed)
Will use celebrex since he has hx of iron deficiency anemia and GERD

## 2017-06-15 ENCOUNTER — Encounter: Payer: Self-pay | Admitting: Family Medicine

## 2017-06-16 MED ORDER — LIRAGLUTIDE -WEIGHT MANAGEMENT 18 MG/3ML ~~LOC~~ SOPN: 0.6000 mg | PEN_INJECTOR | Freq: Every day | SUBCUTANEOUS | 0 refills | Status: DC

## 2017-06-16 MED ORDER — PEN NEEDLES 31G X 6 MM MISC: 1 refills | Status: DC

## 2017-06-16 MED ORDER — BUPROPION HCL ER (XL) 150 MG PO TB24: 150.0000 mg | ORAL_TABLET | Freq: Every day | ORAL | 11 refills | Status: DC

## 2017-06-25 ENCOUNTER — Other Ambulatory Visit: Payer: Self-pay | Admitting: Family Medicine

## 2017-06-25 MED ORDER — CELECOXIB 100 MG PO CAPS: ORAL_CAPSULE | ORAL | 2 refills | Status: DC

## 2017-06-26 NOTE — Telephone Encounter (Signed)
Please ask pt to schedule a CPE and we'll get fasting labs that day

## 2017-06-26 NOTE — Telephone Encounter (Signed)
LMOM for pt to call and schedule an appt.

## 2017-07-11 ENCOUNTER — Ambulatory Visit (INDEPENDENT_AMBULATORY_CARE_PROVIDER_SITE_OTHER): Payer: BLUE CROSS/BLUE SHIELD | Admitting: Family Medicine

## 2017-07-11 ENCOUNTER — Encounter: Payer: Self-pay | Admitting: Family Medicine

## 2017-07-11 VITALS — BP 130/82 | HR 82 | Temp 98.2°F | Resp 14 | Ht 66.5 in | Wt 262.3 lb

## 2017-07-11 DIAGNOSIS — Z114 Encounter for screening for human immunodeficiency virus [HIV]: Secondary | ICD-10-CM | POA: Diagnosis not present

## 2017-07-11 DIAGNOSIS — Z0000 Encounter for general adult medical examination without abnormal findings: Secondary | ICD-10-CM

## 2017-07-11 LAB — CBC WITH DIFFERENTIAL/PLATELET
BASOS ABS: 66 {cells}/uL (ref 0–200)
Basophils Relative: 1 %
EOS ABS: 198 {cells}/uL (ref 15–500)
Eosinophils Relative: 3 %
HEMATOCRIT: 46 % (ref 38.5–50.0)
HEMOGLOBIN: 15.1 g/dL (ref 13.2–17.1)
Lymphocytes Relative: 31 %
Lymphs Abs: 2046 cells/uL (ref 850–3900)
MCH: 30 pg (ref 27.0–33.0)
MCHC: 32.8 g/dL (ref 32.0–36.0)
MCV: 91.5 fL (ref 80.0–100.0)
MPV: 10.8 fL (ref 7.5–12.5)
Monocytes Absolute: 660 cells/uL (ref 200–950)
Monocytes Relative: 10 %
NEUTROS ABS: 3630 {cells}/uL (ref 1500–7800)
NEUTROS PCT: 55 %
Platelets: 234 10*3/uL (ref 140–400)
RBC: 5.03 MIL/uL (ref 4.20–5.80)
RDW: 13.8 % (ref 11.0–15.0)
WBC: 6.6 10*3/uL (ref 3.8–10.8)

## 2017-07-11 LAB — COMPLETE METABOLIC PANEL WITH GFR
ALBUMIN: 3.9 g/dL (ref 3.6–5.1)
ALK PHOS: 68 U/L (ref 40–115)
ALT: 23 U/L (ref 9–46)
AST: 20 U/L (ref 10–40)
BILIRUBIN TOTAL: 0.4 mg/dL (ref 0.2–1.2)
BUN: 8 mg/dL (ref 7–25)
CALCIUM: 9.3 mg/dL (ref 8.6–10.3)
CO2: 31 mmol/L (ref 20–32)
Chloride: 98 mmol/L (ref 98–110)
Creat: 0.86 mg/dL (ref 0.60–1.35)
GLUCOSE: 87 mg/dL (ref 65–99)
Potassium: 3.5 mmol/L (ref 3.5–5.3)
Sodium: 139 mmol/L (ref 135–146)
TOTAL PROTEIN: 6.5 g/dL (ref 6.1–8.1)

## 2017-07-11 LAB — LIPID PANEL
CHOLESTEROL: 176 mg/dL (ref <200)
HDL: 38 mg/dL — ABNORMAL LOW (ref >40)
LDL Cholesterol: 119 mg/dL — ABNORMAL HIGH (ref <100)
TRIGLYCERIDES: 95 mg/dL (ref <150)
Total CHOL/HDL Ratio: 4.6 Ratio (ref <5.0)
VLDL: 19 mg/dL (ref <30)

## 2017-07-11 LAB — TSH: TSH: 2.31 mIU/L (ref 0.40–4.50)

## 2017-07-11 NOTE — Patient Instructions (Addendum)
Let's get labs today Check out the information at familydoctor.org entitled "Nutrition for Weight Loss: What You Need to Know about Fad Diets" Try to lose between 1-2 pounds per week by taking in fewer calories and burning off more calories You can succeed by limiting portions, limiting foods dense in calories and fat, becoming more active, and drinking 8 glasses of water a day (64 ounces) Don't skip meals, especially breakfast, as skipping meals may alter your metabolism Do not use over-the-counter weight loss pills or gimmicks that claim rapid weight loss A healthy BMI (or body mass index) is between 18.5 and 24.9 You can calculate your ideal BMI at the [place] website [employer]fr  Health Maintenance, Male A healthy lifestyle and preventive care is important for your health and wellness. Ask your health care provider about what schedule of regular examinations is right for you. What should I know about weight and diet? Eat a Healthy Diet  Eat plenty of vegetables, fruits, whole grains, low-fat dairy products, and lean protein.  Do not eat a lot of foods high in solid fats, added sugars, or salt.  Maintain a Healthy Weight Regular exercise can help you achieve or maintain a healthy weight. You should:  Do at least 150 minutes of exercise each week. The exercise should increase your heart rate and make you sweat (moderate-intensity exercise).  Do strength-training exercises at least twice a week.  Watch Your Levels of Cholesterol and Blood Lipids  Have your blood tested for lipids and cholesterol every 5 years starting at 46 years of age. If you are at high risk for heart disease, you should start having your blood tested when you are 46 years old. You may need to have your cholesterol levels checked more often if: ? Your lipid or cholesterol levels are high. ? You are older than 46 years of age. ? You are at high risk for heart  disease.  What should I know about cancer screening? Many types of cancers can be detected early and may often be prevented. Lung Cancer  You should be screened every year for lung cancer if: ? You are a current smoker who has smoked for at least 30 years. ? You are a former smoker who has quit within the past 15 years.  Talk to your health care provider about your screening options, when you should start screening, and how often you should be screened.  Colorectal Cancer  Routine colorectal cancer screening usually begins at 46 years of age and should be repeated every 5-10 years until you are 46 years old. You may need to be screened more often if early forms of precancerous polyps or small growths are found. Your health care provider may recommend screening at an earlier age if you have risk factors for colon cancer.  Your health care provider may recommend using home test kits to check for hidden blood in the stool.  A small camera at the end of a tube can be used to examine your colon (sigmoidoscopy or colonoscopy). This checks for the earliest forms of colorectal cancer.  Prostate and Testicular Cancer  Depending on your age and overall health, your health care provider may do certain tests to screen for prostate and testicular cancer.  Talk to your health care provider about any symptoms or concerns you have about testicular or prostate cancer.  Skin Cancer  Check your skin from head to toe regularly.  Tell your health care provider about any new moles or changes in moles,  especially if: ? There is a change in a mole's size, shape, or color. ? You have a mole that is larger than a pencil eraser.  Always use sunscreen. Apply sunscreen liberally and repeat throughout the day.  Protect yourself by wearing long sleeves, pants, a wide-brimmed hat, and sunglasses when outside.  What should I know about heart disease, diabetes, and high blood pressure?  If you are 18-39 years  of age, have your blood pressure checked every 3-5 years. If you are 23 years of age or older, have your blood pressure checked every year. You should have your blood pressure measured twice-once when you are at a hospital or clinic, and once when you are not at a hospital or clinic. Record the average of the two measurements. To check your blood pressure when you are not at a hospital or clinic, you can use: ? An automated blood pressure machine at a pharmacy. ? A home blood pressure monitor.  Talk to your health care provider about your target blood pressure.  If you are between 8-63 years old, ask your health care provider if you should take aspirin to prevent heart disease.  Have regular diabetes screenings by checking your fasting blood sugar level. ? If you are at a normal weight and have a low risk for diabetes, have this test once every three years after the age of 87. ? If you are overweight and have a high risk for diabetes, consider being tested at a younger age or more often.  A one-time screening for abdominal aortic aneurysm (AAA) by ultrasound is recommended for men aged 60-75 years who are current or former smokers. What should I know about preventing infection? Hepatitis B If you have a higher risk for hepatitis B, you should be screened for this virus. Talk with your health care provider to find out if you are at risk for hepatitis B infection. Hepatitis C Blood testing is recommended for:  Everyone born from [birth year] through 1965.  Anyone with known risk factors for hepatitis C.  Sexually Transmitted Diseases (STDs)  You should be screened each year for STDs including gonorrhea and chlamydia if: ? You are sexually active and are younger than 46 years of age. ? You are older than 46 years of age and your health care provider tells you that you are at risk for this type of infection. ? Your sexual activity has changed since you were last screened and you are at an increased  risk for chlamydia or gonorrhea. Ask your health care provider if you are at risk.  Talk with your health care provider about whether you are at high risk of being infected with HIV. Your health care provider may recommend a prescription medicine to help prevent HIV infection.  What else can I do?  Schedule regular health, dental, and eye exams.  Stay current with your vaccines (immunizations).  Do not use any tobacco products, such as cigarettes, chewing tobacco, and e-cigarettes. If you need help quitting, ask your health care provider.  Limit alcohol intake to no more than 2 drinks per day. One drink equals 12 ounces of beer, 5 ounces of wine, or 1 ounces of hard liquor.  Do not use street drugs.  Do not share needles.  Ask your health care provider for help if you need support or information about quitting drugs.  Tell your health care provider if you often feel depressed.  Tell your health care provider if you have ever been abused or  do not feel safe at home. This information is not intended to replace advice given to you by your health care provider. Make sure you discuss any questions you have with your health care provider. Document Released: 05/16/2008 Document Revised: 07/17/2016 Document Reviewed: 08/22/2015 Elsevier Interactive Patient Education  [employer].

## 2017-07-11 NOTE — Assessment & Plan Note (Signed)
Check labs USPSTF grade A and B recommendations reviewed with patient; age-appropriate recommendations, preventive care, screening tests, etc discussed and encouraged; healthy living encouraged; see AVS for patient education given to patient  

## 2017-07-11 NOTE — Progress Notes (Signed)
Patient ID: Tanner Quan., male   DOB: March 17, 1971, 46 y.o.   MRN: [id number]   Subjective:   Tanner Baskette. is a 46 y.o. male here for a complete physical exam  Interim issues since last visit:  USPSTF grade A and B recommendations Depression:  Depression screen [facility] 2/9 07/11/2017 06/06/2017 03/28/2017 08/30/2016 07/04/2016  Decreased Interest 0 0 0 0 0  Down, Depressed, Hopeless 0 0 0 0 0  PHQ - 2 Score 0 0 0 0 0   Hypertension: BP Readings from Last 3 Encounters:  07/11/17 130/82  06/06/17 132/86  03/28/17 130/84   Obesity: Wt Readings from Last 3 Encounters:  07/11/17 262 lb 4.8 oz (119 kg)  06/06/17 262 lb 1.6 oz (118.9 kg)  03/28/17 255 lb 11.2 oz (116 kg)   BMI Readings from Last 3 Encounters:  07/11/17 41.70 kg/m  06/06/17 39.85 kg/m  03/28/17 38.88 kg/m    Alcohol: well under target Tobacco use: no HIV, hep B, hep C: check HIV Divorced STD testing and prevention (chl/gon/syphilis): declined Lipids:  Lab Results  Component Value Date   CHOL 171 07/04/2016   CHOL 177 05/23/2015   Lab Results  Component Value Date   HDL 41 07/04/2016   HDL 40 05/23/2015   Lab Results  Component Value Date   LDLCALC 117 07/04/2016   LDLCALC 123 (H) 05/23/2015   Lab Results  Component Value Date   TRIG 66 07/04/2016   TRIG 69 05/23/2015   Lab Results  Component Value Date   CHOLHDL 4.2 07/04/2016   No results found for: LDLDIRECT Glucose:  Glucose, Bld  Date Value Ref Range Status  07/04/2016 84 65 - 99 mg/dL Final  07/26/2015 123 (H) 65 - 99 mg/dL Final  07/18/2015 69 65 - 99 mg/dL Final   Colorectal cancer: no colon cancer in family; repeat colonoscopy due around age 89 or 56 Prostate cancer: no fam hx No results found for: PSA Lung cancer:  n/a AAA: n/a Aspirin: n/a Diet: not enough calcium, but enough fruits and veggies; does eat red meat Exercise: lots of walking, also has gym Skin cancer: nothing worrisome  Past Medical History:    Diagnosis Date  . Allergic rhinitis   . Allergy   . Anemia iron deficiency  . Anxiety   . Arthritis   . Asthma   . Chronic pain of left elbow   . Dysrhythmia   . GERD (gastroesophageal reflux disease)   . Headache   . Hypertension   . Obese 05/22/2015  . Obesity   . RLS (restless legs syndrome)   . Shortness of breath dyspnea   . Sleep apnea    Past Surgical History:  Procedure Laterality Date  . CHOLECYSTECTOMY N/A 07/25/2015   Procedure: LAPAROSCOPIC CHOLECYSTECTOMY WITH INTRAOPERATIVE CHOLANGIOGRAM;  Surgeon: Florene Glen, MD;  Location: [facility] ORS;  Service: General;  Laterality: N/A;  . COLONOSCOPY WITH ESOPHAGOGASTRODUODENOSCOPY (EGD)  04/28/2014  . CRANIOTOMY  2003  . ELBOW FRACTURE SURGERY  2007   suffered from [place] and had to undo surgical procedure  . TONSILLECTOMY AND ADENOIDECTOMY  1978   Family History  Problem Relation Age of Onset  . Osteoporosis Mother 85  . Heart attack Father 4  . Heart disease Father   . Kidney disease Father   . Drug abuse Sister    Social History  Substance Use Topics  . Smoking status: Never Smoker  . Smokeless tobacco: Former Systems developer    Types: Chew  .  Alcohol use Yes     Comment: Occasional   Review of Systems  Constitutional: Negative for unexpected weight change.  Respiratory: Negative for shortness of breath.   Cardiovascular: Negative for chest pain.  Gastrointestinal: Negative for blood in stool.  Endocrine: Negative for polydipsia.  Genitourinary: Negative for discharge and hematuria.  Allergic/Immunologic: Negative for food allergies.  Neurological: Positive for tremors (every once in a while on weird schedule, if sleep deprived or hungry).  Hematological: Does not bruise/bleed easily.  Psychiatric/Behavioral: Negative for dysphoric mood.    Objective:   Vitals:   07/11/17 1103  BP: 130/82  Pulse: 82  Resp: 14  Temp: 98.2 F (36.8 C)  TempSrc: Oral  SpO2: 96%  Weight: 262 lb 4.8 oz (119 kg)  Height:  5' 6.5" (1.689 m)   Body mass index is 41.7 kg/m. Wt Readings from Last 3 Encounters:  07/11/17 262 lb 4.8 oz (119 kg)  06/06/17 262 lb 1.6 oz (118.9 kg)  03/28/17 255 lb 11.2 oz (116 kg)   Physical Exam  Constitutional: He appears well-developed and well-nourished. No distress.  HENT:  Head: Normocephalic and atraumatic.    Right Ear: Tympanic membrane and ear canal normal.  Left Ear: Tympanic membrane and ear canal normal.  Nose: Nose normal. No rhinorrhea.  Mouth/Throat: Oropharynx is clear and moist and mucous membranes are normal.  Surgical scar posterior skull base  Eyes: EOM are normal. No scleral icterus.  Neck: No JVD present. No thyromegaly present.  Cardiovascular: Normal rate, regular rhythm and normal heart sounds.   Pulmonary/Chest: Effort normal and breath sounds normal. No respiratory distress. He has no wheezes. He has no rales.  Abdominal: Soft. Bowel sounds are normal. He exhibits no distension. There is no tenderness. There is no guarding.  Musculoskeletal: Normal range of motion. He exhibits no edema.       Right elbow: He exhibits deformity.       Left elbow: He exhibits deformity.       Right forearm: He exhibits deformity.       Left forearm: He exhibits deformity.  Lymphadenopathy:    He has no cervical adenopathy.  Neurological: He is alert. He displays normal reflexes. He exhibits normal muscle tone. Coordination normal.  Skin: Skin is warm and dry. No rash noted. He is not diaphoretic. No erythema. No pallor.  Psychiatric: He has a normal mood and affect. His behavior is normal. Judgment and thought content normal.    Assessment/Plan:   Problem List Items Addressed This Visit      Other   Preventative health care - Primary    Check labs USPSTF grade A and B recommendations reviewed with patient; age-appropriate recommendations, preventive care, screening tests, etc discussed and encouraged; healthy living encouraged; see AVS for patient  education given to patient      Relevant Orders   CBC with Differential/Platelet   COMPLETE METABOLIC PANEL WITH GFR   Lipid panel   TSH    Other Visit Diagnoses    Encounter for screening for HIV       Relevant Orders   HIV antibody      Meds ordered this encounter  Medications  . acetaminophen (TYLENOL) 650 MG CR tablet    Sig: Take 650 mg by mouth every 6 (six) hours as needed for pain.   Orders Placed This Encounter  Procedures  . CBC with Differential/Platelet  . COMPLETE METABOLIC PANEL WITH GFR  . Lipid panel  . TSH  . HIV antibody  Follow up plan: Return in about 1 year (around 07/11/2018) for complete physical.  An After Visit Summary was printed and given to the patient.

## 2017-07-12 ENCOUNTER — Other Ambulatory Visit: Payer: Self-pay | Admitting: Family Medicine

## 2017-07-12 LAB — HIV ANTIBODY (ROUTINE TESTING W REFLEX): HIV: NONREACTIVE

## 2017-07-14 ENCOUNTER — Encounter: Payer: Self-pay | Admitting: Family Medicine

## 2017-07-14 NOTE — Telephone Encounter (Signed)
Left detailed voicemail to return call about med

## 2017-07-14 NOTE — Telephone Encounter (Signed)
Please find out if patient has already run through 20 pills plus the refill, just prescribed on 06/06/17 (phenergan)

## 2017-07-15 MED ORDER — HYDROCHLOROTHIAZIDE 25 MG PO TABS: 25.0000 mg | ORAL_TABLET | Freq: Every day | ORAL | 11 refills | Status: DC

## 2017-07-18 NOTE — Telephone Encounter (Signed)
Left patient another voicemail.

## 2017-07-21 NOTE — Telephone Encounter (Signed)
I have called multiple times with no response.

## 2017-10-06 ENCOUNTER — Other Ambulatory Visit: Payer: Self-pay

## 2017-10-06 MED ORDER — PRAMIPEXOLE DIHYDROCHLORIDE 0.5 MG PO TABS: 0.5000 mg | ORAL_TABLET | Freq: Every evening | ORAL | 11 refills | Status: DC | PRN

## 2017-10-06 NOTE — Telephone Encounter (Signed)
Refill request for general medication: Mirapex  Last office visit: 07/11/2017  Last physical exam: 07/11/2017  Follow up: None indicated

## 2017-10-10 ENCOUNTER — Other Ambulatory Visit: Payer: Self-pay

## 2017-10-10 MED ORDER — MONTELUKAST SODIUM 10 MG PO TABS: 10.0000 mg | ORAL_TABLET | Freq: Every day | ORAL | 3 refills | Status: DC

## 2017-10-10 MED ORDER — BUPROPION HCL ER (XL) 150 MG PO TB24: 150.0000 mg | ORAL_TABLET | Freq: Every day | ORAL | 3 refills | Status: DC

## 2017-10-10 MED ORDER — HYDROCHLOROTHIAZIDE 25 MG PO TABS: 25.0000 mg | ORAL_TABLET | Freq: Every day | ORAL | 3 refills | Status: DC

## 2017-10-10 NOTE — Telephone Encounter (Signed)
rxs sent

## 2017-10-10 NOTE — Telephone Encounter (Signed)
Patient switching to mail order and will need 90 day supply

## 2017-10-13 ENCOUNTER — Other Ambulatory Visit: Payer: Self-pay

## 2017-10-13 MED ORDER — PRAMIPEXOLE DIHYDROCHLORIDE 0.5 MG PO TABS: 0.5000 mg | ORAL_TABLET | Freq: Every evening | ORAL | 3 refills | Status: DC | PRN

## 2017-10-13 NOTE — Telephone Encounter (Signed)
Ins requesting 90 day 

## 2017-10-14 ENCOUNTER — Ambulatory Visit
Admission: RE | Admit: 2017-10-14 | Discharge: 2017-10-14 | Disposition: A | Discharge status: Home / Self Care | Payer: BLUE CROSS/BLUE SHIELD | Source: Ambulatory Visit | Attending: Family Medicine | Admitting: Family Medicine

## 2017-10-14 ENCOUNTER — Encounter: Payer: Self-pay | Admitting: Family Medicine

## 2017-10-14 ENCOUNTER — Ambulatory Visit: Payer: BLUE CROSS/BLUE SHIELD | Admitting: Family Medicine

## 2017-10-14 DIAGNOSIS — M25561 Pain in right knee: Secondary | ICD-10-CM

## 2017-10-14 DIAGNOSIS — G4700 Insomnia, unspecified: Secondary | ICD-10-CM | POA: Insufficient documentation

## 2017-10-14 DIAGNOSIS — M25562 Pain in left knee: Secondary | ICD-10-CM | POA: Insufficient documentation

## 2017-10-14 DIAGNOSIS — R0683 Snoring: Secondary | ICD-10-CM

## 2017-10-14 MED ORDER — TRAZODONE HCL 100 MG PO TABS: 50.0000 mg | ORAL_TABLET | Freq: Every day | ORAL | 1 refills | Status: DC

## 2017-10-14 MED ORDER — TRAZODONE HCL 100 MG PO TABS: 50.0000 mg | ORAL_TABLET | Freq: Every day | ORAL | 5 refills | Status: DC

## 2017-10-14 MED ORDER — CELECOXIB 200 MG PO CAPS: 200.0000 mg | ORAL_CAPSULE | Freq: Two times a day (BID) | ORAL | 1 refills | Status: DC | PRN

## 2017-10-14 NOTE — Progress Notes (Signed)
BP 122/78 (BP Location: Left Arm, Patient Position: Sitting, Cuff Size: Large)   Pulse 78   Temp (!) 97.4 F (36.3 C) (Oral)   Ht 5' 6.5" (1.689 m)   Wt 264 lb 14.4 oz (120.2 kg)   SpO2 95%   BMI 42.12 kg/m    Subjective:    Patient ID: Tanner Ryan., male    DOB: 09-Oct-1971, 46 y.o.   MRN: [id number]  HPI: Tanner Ryan is a 46 y.o. male  Chief Complaint  Patient presents with  . Knee Pain    Bilateral pain   . Insomnia    HPI Patient is here for two issues: knee pain and insomnia  He works 6:30 pm to 6:30 am three nights a week; he gets home by 9 am and only sleeps 3-4 hours; good sun coverage over the windows; keeps room cool; not sharing bed; apartment, top floor, no noisy neighbors; kids are gone to school while he is sleeping;  He stops drinking caffeine around midnight; not good deep sleep; three story apartment, can hear the door downstairs; light sleeper; snores some; may have some sleep apnea; he was evaluated maybe 8-10 years ago he thinks  He is bothered by knee pain; stiff and achy all the time; is there prescription-strength tylenol? 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 Allergic rhinitis   . Allergy   . Anemia iron deficiency  . Anxiety   . Arthritis   . Asthma   . Chronic pain of left elbow   . Dysrhythmia   . GERD (gastroesophageal reflux disease)   . Headache   . Hypertension   . Obese 05/22/2015  . Obesity   . RLS (restless legs syndrome)   . Shortness of breath dyspnea   . Sleep  apnea    Past Surgical History:  Procedure Laterality Date  . COLONOSCOPY WITH ESOPHAGOGASTRODUODENOSCOPY (EGD)  04/28/2014  . CRANIOTOMY  2003  . ELBOW FRACTURE SURGERY  2007   suffered from [place] and had to undo surgical procedure  . TONSILLECTOMY AND ADENOIDECTOMY  1978   Family History  Problem Relation Age of Onset  . Osteoporosis Mother 2  . Heart attack Father 77  . Heart disease Father   . Kidney disease Father   . Drug abuse Sister    Social History   Tobacco Use  . Smoking status: Never Smoker  . Smokeless tobacco: Former Systems developer    Types: Chew  Substance Use Topics  . Alcohol use: Yes    Comment: Occasional  . Drug use: No     Interim medical history since last visit reviewed. Allergies and medications reviewed  Review of Systems Per HPI unless specifically indicated above     Objective:    BP 122/78 (BP Location: Left Arm, Patient Position: Sitting, Cuff Size: Large)   Pulse 78   Temp (!) 97.4 F (36.3 C) (Oral)   Ht 5' 6.5" (1.689 m)   Wt 264 lb  14.4 oz (120.2 kg)   SpO2 95%   BMI 42.12 kg/m   Wt Readings from Last 3 Encounters:  10/14/17 264 lb 14.4 oz (120.2 kg)  07/11/17 262 lb 4.8 oz (119 kg)  06/06/17 262 lb 1.6 oz (118.9 kg)    Physical Exam  Constitutional: He appears well-developed and well-nourished. No distress.  Morbidly obese  Eyes: No scleral icterus.  Cardiovascular: Normal rate and regular rhythm.  Pulmonary/Chest: Effort normal and breath sounds normal.  Musculoskeletal: He exhibits no edema.       Right knee: He exhibits decreased range of motion. No tenderness found.       Left knee: He exhibits decreased range of motion. No tenderness found.  Crepitus bilaterally with active flexion and extension  Neurological: He is alert.  Skin: No pallor.  Psychiatric: He has a normal mood and affect.   Results for orders placed or performed in visit on 07/11/17  CBC with Differential/Platelet  Result Value Ref Range   WBC 6.6 3.8  - 10.8 K/uL   RBC 5.03 4.20 - 5.80 MIL/uL   Hemoglobin 15.1 13.2 - 17.1 g/dL   HCT 46.0 38.5 - 50.0 %   MCV 91.5 80.0 - 100.0 fL   MCH 30.0 27.0 - 33.0 pg   MCHC 32.8 32.0 - 36.0 g/dL   RDW 13.8 11.0 - 15.0 %   Platelets 234 140 - 400 K/uL   MPV 10.8 7.5 - 12.5 fL   Neutro Abs 3,630 1,500 - 7,800 cells/uL   Lymphs Abs 2,046 850 - 3,900 cells/uL   Monocytes Absolute 660 200 - 950 cells/uL   Eosinophils Absolute 198 15 - 500 cells/uL   Basophils Absolute 66 0 - 200 cells/uL   Neutrophils Relative % 55 %   Lymphocytes Relative 31 %   Monocytes Relative 10 %   Eosinophils Relative 3 %   Basophils Relative 1 %   Smear Review Criteria for review not met   COMPLETE METABOLIC PANEL WITH GFR  Result Value Ref Range   Sodium 139 135 - 146 mmol/L   Potassium 3.5 3.5 - 5.3 mmol/L   Chloride 98 98 - 110 mmol/L   CO2 31 20 - 32 mmol/L   Glucose, Bld 87 65 - 99 mg/dL   BUN 8 7 - 25 mg/dL   Creat 0.86 0.60 - 1.35 mg/dL   Total Bilirubin 0.4 0.2 - 1.2 mg/dL   Alkaline Phosphatase 68 40 - 115 U/L   AST 20 10 - 40 U/L   ALT 23 9 - 46 U/L   Total Protein 6.5 6.1 - 8.1 g/dL   Albumin 3.9 3.6 - 5.1 g/dL   Calcium 9.3 8.6 - 10.3 mg/dL   GFR, Est African American >89 >=60 mL/min   GFR, Est Non African American >89 >=60 mL/min  Lipid panel  Result Value Ref Range   Cholesterol 176 <200 mg/dL   Triglycerides 95 <150 mg/dL   HDL 38 (L) >40 mg/dL   Total CHOL/HDL Ratio 4.6 <5.0 Ratio   VLDL 19 <30 mg/dL   LDL Cholesterol 119 (H) <100 mg/dL  TSH  Result Value Ref Range   TSH 2.31 0.40 - 4.50 mIU/L  HIV antibody  Result Value Ref Range   HIV 1&2 Ab, 4th Generation NONREACTIVE NONREACTIVE      Assessment & Plan:   Problem List Items Addressed This Visit      Other   Snores    Refer for sleep evaluation, r/o OSA  Relevant Orders   Ambulatory referral to Pulmonology   Morbid obesity ([place])    Urged weight loss      Insomnia    Refer for sleep evaluation; will try trazodone;  avoid caffeine and chocolate and tea for 6-10 hours prior to initiation of sleep      Relevant Orders   Ambulatory referral to Pulmonology   Arthralgia of both knees    Order xrays and increase celebrex; try topical arthritis cream; weight loss stressed per AAOS recommendations      Relevant Orders   DG Knee Complete 4 Views Right   DG Knee Complete 4 Views Left       Follow up plan: Return in about 4 weeks (around 11/11/2017) for follow-up visit with Dr. Sanda Klein.  An after-visit summary was printed and given to the patient at [facility].  Please see the patient instructions which may contain other information and recommendations beyond what is mentioned above in the assessment and plan.  Meds ordered this encounter  Medications  . DISCONTD: traZODone (DESYREL) 100 MG tablet    Sig: Take 0.5-1 tablets (50-100 mg total) at bedtime by mouth.    Dispense:  30 tablet    Refill:  5  . celecoxib (CELEBREX) 200 MG capsule    Sig: Take 1 capsule (200 mg total) 2 (two) times daily as needed by mouth.    Dispense:  180 capsule    Refill:  1  . traZODone (DESYREL) 100 MG tablet    Sig: Take 0.5-1 tablets (50-100 mg total) at bedtime by mouth.    Dispense:  90 tablet    Refill:  1    Orders Placed This Encounter  Procedures  . DG Knee Complete 4 Views Right  . DG Knee Complete 4 Views Left  . Ambulatory referral to Pulmonology

## 2017-10-14 NOTE — Patient Instructions (Addendum)
Avoid caffeine, chocolate, tea, etc for 6-10 hours prior to sleep onset We'll have you see the sleep specialist Try the trazodone before sleep  Check out the information at familydoctor.org entitled "Nutrition for Weight Loss: What You Need to Know about Fad Diets" Try to lose between 1-2 pounds per week by taking in fewer calories and burning off more calories You can succeed by limiting portions, limiting foods dense in calories and fat, becoming more active, and drinking 8 glasses of water a day (64 ounces) Don't skip meals, especially breakfast, as skipping meals may alter your metabolism Do not use over-the-counter weight loss pills or gimmicks that claim rapid weight loss A healthy BMI (or body mass index) is between 18.5 and 24.9 You can calculate your ideal BMI at the [place] website ClubMon[employer].fr  Insomnia Insomnia is a sleep disorder that makes it difficult to fall asleep or to stay asleep. Insomnia can cause tiredness (fatigue), low energy, difficulty concentrating, mood swings, and poor performance at work or school. There are three different ways to classify insomnia:  Difficulty falling asleep.  Difficulty staying asleep.  Waking up too early in the morning.  Any type of insomnia can be long-term (chronic) or short-term (acute). Both are common. Short-term insomnia usually lasts for three months or less. Chronic insomnia occurs at least three times a week for longer than three months. What are the causes? Insomnia may be caused by another condition, situation, or substance, such as:  Anxiety.  Certain medicines.  Gastroesophageal reflux disease (GERD) or other gastrointestinal conditions.  Asthma or other breathing conditions.  Restless legs syndrome, sleep apnea, or other sleep disorders.  Chronic pain.  Menopause. This may include hot flashes.  Stroke.  Abuse of alcohol, tobacco, or illegal  drugs.  Depression.  Caffeine.  Neurological disorders, such as Alzheimer disease.  An overactive thyroid (hyperthyroidism).  The cause of insomnia may not be known. What increases the risk? Risk factors for insomnia include:  Gender. Women are more commonly affected than men.  Age. Insomnia is more common as you get older.  Stress. This may involve your professional or personal life.  Income. Insomnia is more common in people with lower income.  Lack of exercise.  Irregular work schedule or night shifts.  Traveling between different time zones.  What are the signs or symptoms? If you have insomnia, trouble falling asleep or trouble staying asleep is the main symptom. This may lead to other symptoms, such as:  Feeling fatigued.  Feeling nervous about going to sleep.  Not feeling rested in the morning.  Having trouble concentrating.  Feeling irritable, anxious, or depressed.  How is this treated? Treatment for insomnia depends on the cause. If your insomnia is caused by an underlying condition, treatment will focus on addressing the condition. Treatment may also include:  Medicines to help you sleep.  Counseling or therapy.  Lifestyle adjustments.  Follow these instructions at home:  Take medicines only as directed by your health care provider.  Keep regular sleeping and waking hours. Avoid naps.  Keep a sleep diary to help you and your health care provider figure out what could be causing your insomnia. Include: ? When you sleep. ? When you wake up during the night. ? How well you sleep. ? How rested you feel the next day. ? Any side effects of medicines you are taking. ? What you eat and drink.  Make your bedroom a comfortable place where it is easy to fall asleep: ? Put up  shades or special blackout curtains to block light from outside. ? Use a white noise machine to block noise. ? Keep the temperature cool.  Exercise regularly as directed by  your health care provider. Avoid exercising right before bedtime.  Use relaxation techniques to manage stress. Ask your health care provider to suggest some techniques that may work well for you. These may include: ? Breathing exercises. ? Routines to release muscle tension. ? Visualizing peaceful scenes.  Cut back on alcohol, caffeinated beverages, and cigarettes, especially close to bedtime. These can disrupt your sleep.  Do not overeat or eat spicy foods right before bedtime. This can lead to digestive discomfort that can make it hard for you to sleep.  Limit screen use before bedtime. This includes: ? Watching TV. ? Using your smartphone, tablet, and computer.  Stick to a routine. This can help you fall asleep faster. Try to do a quiet activity, brush your teeth, and go to bed at the same time each night.  Get out of bed if you are still awake after 15 minutes of trying to sleep. Keep the lights down, but try reading or doing a quiet activity. When you feel sleepy, go back to bed.  Make sure that you drive carefully. Avoid driving if you feel very sleepy.  Keep all follow-up appointments as directed by your health care provider. This is important. Contact a health care provider if:  You are tired throughout the day or have trouble in your daily routine due to sleepiness.  You continue to have sleep problems or your sleep problems get worse. Get help right away if:  You have serious thoughts about hurting yourself or someone else. This information is not intended to replace advice given to you by your health care provider. Make sure you discuss any questions you have with your health care provider. Document Released: 11/15/2000 Document Revised: 04/19/2016 Document Reviewed: 08/19/2014 Elsevier Interactive Patient Education  [employer].

## 2017-10-14 NOTE — Assessment & Plan Note (Signed)
Urged weight loss

## 2017-10-14 NOTE — Assessment & Plan Note (Signed)
Order xrays and increase celebrex; try topical arthritis cream; weight loss stressed per AAOS recommendations

## 2017-10-14 NOTE — Assessment & Plan Note (Signed)
Refer for sleep evaluation, r/o OSA

## 2017-10-14 NOTE — Assessment & Plan Note (Signed)
Refer for sleep evaluation; will try trazodone; avoid caffeine and chocolate and tea for 6-10 hours prior to initiation of sleep

## 2017-10-30 ENCOUNTER — Institutional Professional Consult (permissible substitution): Payer: BLUE CROSS/BLUE SHIELD | Admitting: Internal Medicine

## 2017-11-14 ENCOUNTER — Ambulatory Visit: Payer: BLUE CROSS/BLUE SHIELD | Admitting: Family Medicine

## 2018-01-17 NOTE — Telephone Encounter (Signed)
Closing out old note from 06/25/17

## 2018-03-03 ENCOUNTER — Other Ambulatory Visit: Payer: Self-pay | Admitting: Family Medicine

## 2018-05-29 ENCOUNTER — Other Ambulatory Visit: Payer: Self-pay | Admitting: Family Medicine

## 2018-07-16 ENCOUNTER — Encounter: Payer: Self-pay | Admitting: Family Medicine

## 2018-07-18 ENCOUNTER — Other Ambulatory Visit: Payer: Self-pay | Admitting: Family Medicine

## 2018-07-24 ENCOUNTER — Encounter: Payer: Self-pay | Admitting: Family Medicine

## 2018-07-24 ENCOUNTER — Ambulatory Visit: Payer: BLUE CROSS/BLUE SHIELD | Admitting: Family Medicine

## 2018-07-24 VITALS — BP 130/74 | HR 86 | Temp 97.7°F | Ht 67.0 in | Wt 266.1 lb

## 2018-07-24 DIAGNOSIS — D509 Iron deficiency anemia, unspecified: Secondary | ICD-10-CM | POA: Diagnosis not present

## 2018-07-24 DIAGNOSIS — J4520 Mild intermittent asthma, uncomplicated: Secondary | ICD-10-CM | POA: Diagnosis not present

## 2018-07-24 DIAGNOSIS — I10 Essential (primary) hypertension: Secondary | ICD-10-CM | POA: Diagnosis not present

## 2018-07-24 DIAGNOSIS — Z23 Encounter for immunization: Secondary | ICD-10-CM

## 2018-07-24 DIAGNOSIS — G43109 Migraine with aura, not intractable, without status migrainosus: Secondary | ICD-10-CM

## 2018-07-24 DIAGNOSIS — M25562 Pain in left knee: Secondary | ICD-10-CM

## 2018-07-24 DIAGNOSIS — M25561 Pain in right knee: Secondary | ICD-10-CM

## 2018-07-24 DIAGNOSIS — E786 Lipoprotein deficiency: Secondary | ICD-10-CM

## 2018-07-24 DIAGNOSIS — Z5181 Encounter for therapeutic drug level monitoring: Secondary | ICD-10-CM

## 2018-07-24 MED ORDER — BUDESONIDE-FORMOTEROL FUMARATE 160-4.5 MCG/ACT IN AERO: 2.0000 | INHALATION_SPRAY | Freq: Two times a day (BID) | RESPIRATORY_TRACT | 11 refills | Status: DC

## 2018-07-24 MED ORDER — HYDROCHLOROTHIAZIDE 25 MG PO TABS: 25.0000 mg | ORAL_TABLET | Freq: Every day | ORAL | 11 refills | Status: DC

## 2018-07-24 MED ORDER — MONTELUKAST SODIUM 10 MG PO TABS: ORAL_TABLET | ORAL | 11 refills | Status: DC

## 2018-07-24 MED ORDER — CELECOXIB 200 MG PO CAPS: 200.0000 mg | ORAL_CAPSULE | Freq: Two times a day (BID) | ORAL | 5 refills | Status: DC | PRN

## 2018-07-24 MED ORDER — BUPROPION HCL ER (XL) 150 MG PO TB24: 150.0000 mg | ORAL_TABLET | Freq: Every day | ORAL | 11 refills | Status: DC

## 2018-07-24 NOTE — Progress Notes (Signed)
BP 130/74   Pulse 86   Temp 97.7 F (36.5 C)   Ht _0  (1.702 m)   Wt 266 lb 1.6 oz (120.7 kg)   SpO2 93%   BMI 41.68 kg/m    Subjective:    Patient ID: Tanner Quan., male    DOB: Oct 16, 1971, 47 y.o.   MRN: [id number]  HPI: Tanner Ryan is a 47 y.o. male  Chief Complaint  Patient presents with  . Medication Refill    HPI Patient is here for f/u  Patient has asthma Heat and humidity making breathing worse; using more often a few weeks ago, few times a day; now it has eased off Any changes in home environment? No Pets? [place] pigs, short-haired, not in the bed Perfumes? Just mild, Old Spice; does not affect breathing Pneumonia vaccine UTD, PPSV-24 Apr 2015 Flu shot today  HTN; well-controlled today; not checking away from the doctor; occasional headaches; usually below 785 systolic; tries to limt salt on food; last kidney function and electorlytes were Jul 11, 2017; occasional cramp in the calves  Migraines with aura Doing "better"; having some eye issues; seeing specialist  Low HDL; father had heart attack, mother had stroke  Anemia; followed here; turned loose by hematologist; no blood in the stool  Arthritis, on celebrex; assumed GI bleed years ago but resolved; celebrex does not upset stomach; just using when needed  Depression screen [facility] 2/9 07/24/2018 10/14/2017 07/11/2017 06/06/2017 03/28/2017  Decreased Interest 0 0 0 0 0  Down, Depressed, Hopeless 0 0 0 0 0  PHQ - 2 Score 0 0 0 0 0  Altered sleeping 3 - - - -  Tired, decreased energy 3 - - - -  Change in appetite 2 - - - -  Feeling bad or failure about yourself  0 - - - -  Trouble concentrating 2 - - - -  Moving slowly or fidgety/restless 0 - - - -  Suicidal thoughts 0 - - - -  PHQ-9 Score 10 - - - -  Difficult doing work/chores Somewhat difficult - - - -    Relevant past medical, surgical, family and social history reviewed Past Medical History:  Diagnosis Date  . Allergic rhinitis   .  Allergy   . Anemia iron deficiency  . Anxiety   . Arthritis   . Asthma   . Chronic pain of left elbow   . Dysrhythmia   . GERD (gastroesophageal reflux disease)   . Headache   . Hypertension   . Obese 05/22/2015  . Obesity   . RLS (restless legs syndrome)   . Shortness of breath dyspnea   . Sleep apnea    Past Surgical History:  Procedure Laterality Date  . CHOLECYSTECTOMY N/A 07/25/2015   Procedure: LAPAROSCOPIC CHOLECYSTECTOMY WITH INTRAOPERATIVE CHOLANGIOGRAM;  Surgeon: Florene Glen, MD;  Location: [facility] ORS;  Service: General;  Laterality: N/A;  . COLONOSCOPY WITH ESOPHAGOGASTRODUODENOSCOPY (EGD)  04/28/2014  . CRANIOTOMY  2003  . ELBOW FRACTURE SURGERY  2007   suffered from [place] and had to undo surgical procedure  . TONSILLECTOMY AND ADENOIDECTOMY  1978   Family History  Problem Relation Age of Onset  . Osteoporosis Mother 4  . Heart attack Father 99  . Heart disease Father   . Kidney disease Father   . Drug abuse Sister    Social History   Tobacco Use  . Smoking status: Never Smoker  . Smokeless tobacco: Former Systems developer  Types: Chew  Substance Use Topics  . Alcohol use: Yes    Comment: Occasional  . Drug use: No    Interim medical history since last visit reviewed. Allergies and medications reviewed  Review of Systems Per HPI unless specifically indicated above     Objective:    BP 130/74   Pulse 86   Temp 97.7 F (36.5 C)   Ht _0  (1.702 m)   Wt 266 lb 1.6 oz (120.7 kg)   SpO2 93%   BMI 41.68 kg/m   Wt Readings from Last 3 Encounters:  07/24/18 266 lb 1.6 oz (120.7 kg)  10/14/17 264 lb 14.4 oz (120.2 kg)  07/11/17 262 lb 4.8 oz (119 kg)    Physical Exam  Constitutional: He appears well-developed and well-nourished. No distress.  Morbidly obese  HENT:  Head: Normocephalic and atraumatic.  Eyes: EOM are normal. No scleral icterus.  Neck: No thyromegaly present.  Cardiovascular: Normal rate and regular rhythm.  Pulmonary/Chest:  Effort normal and breath sounds normal.  Abdominal: Soft. Bowel sounds are normal. He exhibits no distension.  Musculoskeletal: He exhibits no edema.  Neurological: He is alert.  Skin: Skin is warm and dry. No pallor.  No nailbed pallor  Psychiatric: He has a normal mood and affect. His mood appears not anxious. He does not exhibit a depressed mood.   Results for orders placed or performed in visit on 07/11/17  CBC with Differential/Platelet  Result Value Ref Range   WBC 6.6 3.8 - 10.8 K/uL   RBC 5.03 4.20 - 5.80 MIL/uL   Hemoglobin 15.1 13.2 - 17.1 g/dL   HCT 46.0 38.5 - 50.0 %   MCV 91.5 80.0 - 100.0 fL   MCH 30.0 27.0 - 33.0 pg   MCHC 32.8 32.0 - 36.0 g/dL   RDW 13.8 11.0 - 15.0 %   Platelets 234 140 - 400 K/uL   MPV 10.8 7.5 - 12.5 fL   Neutro Abs 3,630 1,500 - 7,800 cells/uL   Lymphs Abs 2,046 850 - 3,900 cells/uL   Monocytes Absolute 660 200 - 950 cells/uL   Eosinophils Absolute 198 15 - 500 cells/uL   Basophils Absolute 66 0 - 200 cells/uL   Neutrophils Relative % 55 %   Lymphocytes Relative 31 %   Monocytes Relative 10 %   Eosinophils Relative 3 %   Basophils Relative 1 %   Smear Review Criteria for review not met   COMPLETE METABOLIC PANEL WITH GFR  Result Value Ref Range   Sodium 139 135 - 146 mmol/L   Potassium 3.5 3.5 - 5.3 mmol/L   Chloride 98 98 - 110 mmol/L   CO2 31 20 - 32 mmol/L   Glucose, Bld 87 65 - 99 mg/dL   BUN 8 7 - 25 mg/dL   Creat 0.86 0.60 - 1.35 mg/dL   Total Bilirubin 0.4 0.2 - 1.2 mg/dL   Alkaline Phosphatase 68 40 - 115 U/L   AST 20 10 - 40 U/L   ALT 23 9 - 46 U/L   Total Protein 6.5 6.1 - 8.1 g/dL   Albumin 3.9 3.6 - 5.1 g/dL   Calcium 9.3 8.6 - 10.3 mg/dL   GFR, Est African American >89 >=60 mL/min   GFR, Est Non African American >89 >=60 mL/min  Lipid panel  Result Value Ref Range   Cholesterol 176 <200 mg/dL   Triglycerides 95 <150 mg/dL   HDL 38 (L) >40 mg/dL   Total CHOL/HDL Ratio 4.6 <5.0 Ratio  VLDL 19 <30 mg/dL   LDL  Cholesterol 119 (H) <100 mg/dL  TSH  Result Value Ref Range   TSH 2.31 0.40 - 4.50 mIU/L  HIV antibody  Result Value Ref Range   HIV 1&2 Ab, 4th Generation NONREACTIVE NONREACTIVE      Assessment & Plan:   Problem List Items Addressed This Visit      Cardiovascular and Mediastinum   Migraine    With aura; watch for s/s of stroke; work with eye doctor, eliminate triggers      Relevant Medications   buPROPion (WELLBUTRIN XL) 150 MG 24 hr tablet   celecoxib (CELEBREX) 200 MG capsule   hydrochlorothiazide (HYDRODIURIL) 25 MG tablet   Benign hypertension    Limit salt; continue meds      Relevant Medications   hydrochlorothiazide (HYDRODIURIL) 25 MG tablet     Respiratory   Asthma - Primary   Relevant Medications   budesonide-formoterol (SYMBICORT) 160-4.5 MCG/ACT inhaler   montelukast (SINGULAIR) 10 MG tablet   Other Relevant Orders   Spirometry with graph     Other   Encounter for medication monitoring   Relevant Orders   COMPLETE METABOLIC PANEL WITH GFR   Morbid obesity (HCC)    Offered help, including bariatric surgeon, nutritionist      Iron deficiency anemia    Check today      Relevant Orders   CBC with Differential/Platelet   Fe+TIBC+Fer   Arthralgia of both knees    Weight loss would help       Other Visit Diagnoses    Need for influenza vaccination       Relevant Orders   Flu Vaccine QUAD 6+ mos PF IM (Fluarix Quad PF) (Completed)   Low HDL (under 40)       Relevant Orders   Lipid panel       Follow up plan: Return in about 4 weeks (around 08/21/2018) for follow-up visit with Dr. Sanda Klein (or just after).  An after-visit summary was printed and given to the patient at [place].  Please see the patient instructions which may contain other information and recommendations beyond what is mentioned above in the assessment and plan.  Meds ordered this encounter  Medications  . budesonide-formoterol (SYMBICORT) 160-4.5 MCG/ACT inhaler    Sig:  Inhale 2 puffs into the lungs 2 (two) times daily. Stop flovent    Dispense:  1 Inhaler    Refill:  11  . buPROPion (WELLBUTRIN XL) 150 MG 24 hr tablet    Sig: Take 1 tablet (150 mg total) by mouth daily.    Dispense:  30 tablet    Refill:  11  . celecoxib (CELEBREX) 200 MG capsule    Sig: Take 1 capsule (200 mg total) by mouth 2 (two) times daily as needed.    Dispense:  60 capsule    Refill:  5  . hydrochlorothiazide (HYDRODIURIL) 25 MG tablet    Sig: Take 1 tablet (25 mg total) by mouth daily.    Dispense:  30 tablet    Refill:  11  . montelukast (SINGULAIR) 10 MG tablet    Sig: TAKE 1 TABLET (10 MG TOTAL) BY MOUTH DAILY.    Dispense:  30 tablet    Refill:  11    Orders Placed This Encounter  Procedures  . Flu Vaccine QUAD 6+ mos PF IM (Fluarix Quad PF)  . CBC with Differential/Platelet  . Lipid panel  . Fe+TIBC+Fer  . COMPLETE METABOLIC PANEL WITH GFR  .  Spirometry with graph

## 2018-07-24 NOTE — Assessment & Plan Note (Signed)
Limit salt; continue meds

## 2018-07-24 NOTE — Assessment & Plan Note (Signed)
Offered help, including Ambulance person, nutritionist

## 2018-07-24 NOTE — Assessment & Plan Note (Signed)
Check today 

## 2018-07-24 NOTE — Patient Instructions (Addendum)
Check out the information at familydoctor.org entitled "Nutrition for Weight Loss: What You Need to Know about Fad Diets" Try to lose between 1-2 pounds per week by taking in fewer calories and burning off more calories You can succeed by limiting portions, limiting foods dense in calories and fat, becoming more active, and drinking 8 glasses of water a day (64 ounces) Don't skip meals, especially breakfast, as skipping meals may alter your metabolism Do not use over-the-counter weight loss pills or gimmicks that claim rapid weight loss A healthy BMI (or body mass index) is between 18.5 and 24.9 You can calculate your ideal BMI at the NIH website http://www.nhlbi.nih.gov/health/educational/lose_wt/BMI/bmicalc.htm   Preventing Unhealthy Weight Gain, Adult Staying at a healthy weight is important. When fat builds up in your body, you may become overweight or obese. These conditions put you at greater risk for developing certain health problems, such as heart disease, diabetes, sleeping problems, joint problems, and some cancers. Unhealthy weight gain is often the result of making unhealthy choices in what you eat. It is also a result of not getting enough exercise. You can make changes to your lifestyle to prevent obesity and stay as healthy as possible. What nutrition changes can be made? To maintain a healthy weight and prevent obesity:  Eat only as much as your body needs. To do this: ? Pay attention to signs that you are hungry or full. Stop eating as soon as you feel full. ? If you feel hungry, try drinking water first. Drink enough water so your urine is clear or pale yellow. ? Eat smaller portions. ? Look at serving sizes on food labels. Most foods contain more than one serving per container. ? Eat the recommended amount of calories for your gender and activity level. While most active people should eat around 2,000 calories per day, if you are trying to lose weight or are not very active,  you main need to eat less calories. Talk to your health care provider or dietitian about how many calories you should eat each day.  Choose healthy foods, such as: ? Fruits and vegetables. Try to fill at least half of your plate at each meal with fruits and vegetables. ? Whole grains, such as whole wheat bread, brown rice, and quinoa. ? Lean meats, such as chicken or fish. ? Other healthy proteins, such as beans, eggs, or tofu. ? Healthy fats, such as nuts, seeds, fatty fish, and olive oil. ? Low-fat or fat-free dairy.  Check food labels and avoid food and drinks that: ? Are high in calories. ? Have added sugar. ? Are high in sodium. ? Have saturated fats or trans fats.  Limit how much you eat of the following foods: ? Prepackaged meals. ? Fast food. ? Fried foods. ? Processed meat, such as bacon, sausage, and deli meats. ? Fatty cuts of red meat and poultry with skin.  Cook foods in healthier ways, such as by baking, broiling, or grilling.  When grocery shopping, try to shop around the outside of the store. This helps you buy mostly fresh foods and avoid canned and prepackaged foods.  What lifestyle changes can be made?  Exercise at least 30 minutes 5 or more days each week. Exercising includes brisk walking, yard work, biking, running, swimming, and team sports like basketball and soccer. Ask your health care provider which exercises are safe for you.  Do not use any products that contain nicotine or tobacco, such as cigarettes and e-cigarettes. If you need help quitting,   ask your health care provider.  Limit alcohol intake to no more than 1 drink a day for nonpregnant women and 2 drinks a day for men. One drink equals 12 oz of beer, 5 oz of wine, or 1 oz of hard liquor.  Try to get 7-9 hours of sleep each night. What other changes can be made?  Keep a food and activity journal to keep track of: ? What you ate and how many calories you had. Remember to count sauces,  dressings, and side dishes. ? Whether you were active, and what exercises you did. ? Your calorie, weight, and activity goals.  Check your weight regularly. Track any changes. If you notice you have gained weight, make changes to your diet or activity routine.  Avoid taking weight-loss medicines or supplements. Talk to your health care provider before starting any new medicine or supplement.  Talk to your health care provider before trying any new diet or exercise plan. Why are these changes important? Eating healthy, staying active, and having healthy habits not only help prevent obesity, they also:  Help you to manage stress and emotions.  Help you to connect with friends and family.  Improve your self-esteem.  Improve your sleep.  Prevent long-term health problems.  What can happen if changes are not made? Being obese or overweight can cause you to develop joint or bone problems, which can make it hard for you to stay active or do activities you enjoy. Being obese or overweight also puts stress on your heart and lungs and can lead to health problems like diabetes, heart disease, and some cancers. Where to find more information: Talk with your health care provider or a dietitian about healthy eating and healthy lifestyle choices. You may also find other information through these resources:  U.S. Department of Agriculture MyPlate: www.choosemyplate.gov  American Heart Association: www.heart.org  Centers for Disease Control and Prevention: www.cdc.gov  Summary  Staying at a healthy weight is important. It helps prevent certain diseases and health problems, such as heart disease, diabetes, joint problems, sleep disorders, and some cancers.  Being obese or overweight can cause you to develop joint or bone problems, which can make it hard for you to stay active or do activities you enjoy.  You can prevent unhealthy weight gain by eating a healthy diet, exercising regularly, not  smoking, limiting alcohol, and getting enough sleep.  Talk with your health care provider or a dietitian for guidance about healthy eating and healthy lifestyle choices. This information is not intended to replace advice given to you by your health care provider. Make sure you discuss any questions you have with your health care provider. Document Released: 11/19/2016 Document Revised: 12/25/2016 Document Reviewed: 12/25/2016 Elsevier Interactive Patient Education  2018 [employer]  

## 2018-07-24 NOTE — Assessment & Plan Note (Signed)
Weight loss would help

## 2018-07-24 NOTE — Assessment & Plan Note (Signed)
With aura; watch for s/s of stroke; work with eye doctor, eliminate triggers

## 2018-07-25 LAB — COMPLETE METABOLIC PANEL WITH GFR
AG Ratio: 1.5 (calc) (ref 1.0–2.5)
ALKALINE PHOSPHATASE (APISO): 66 U/L (ref 40–115)
ALT: 22 U/L (ref 9–46)
AST: 24 U/L (ref 10–40)
Albumin: 4 g/dL (ref 3.6–5.1)
BILIRUBIN TOTAL: 0.6 mg/dL (ref 0.2–1.2)
BUN: 7 mg/dL (ref 7–25)
CHLORIDE: 100 mmol/L (ref 98–110)
CO2: 31 mmol/L (ref 20–32)
Calcium: 9.1 mg/dL (ref 8.6–10.3)
Creat: 0.93 mg/dL (ref 0.60–1.35)
GFR, Est African American: 114 mL/min/{1.73_m2} (ref >=60)
GFR, Est Non African American: 98 mL/min/{1.73_m2} (ref >=60)
GLUCOSE: 90 mg/dL (ref 65–139)
Globulin: 2.6 g/dL (calc) (ref 1.9–3.7)
Potassium: 3.4 mmol/L — ABNORMAL LOW (ref 3.5–5.3)
Sodium: 141 mmol/L (ref 135–146)
TOTAL PROTEIN: 6.6 g/dL (ref 6.1–8.1)

## 2018-07-25 LAB — IRON,TIBC AND FERRITIN PANEL
%SAT: 17 % — AB (ref 20–48)
FERRITIN: 48 ng/mL (ref 38–380)
IRON: 62 ug/dL (ref 50–180)
TIBC: 373 mcg/dL (calc) (ref 250–425)

## 2018-07-25 LAB — CBC WITH DIFFERENTIAL/PLATELET
Basophils Absolute: 67 cells/uL (ref 0–200)
Basophils Relative: 1.1 %
EOS PCT: 3 %
Eosinophils Absolute: 183 cells/uL (ref 15–500)
HCT: 43.3 % (ref 38.5–50.0)
Hemoglobin: 14.7 g/dL (ref 13.2–17.1)
Lymphs Abs: 1391 cells/uL (ref 850–3900)
MCH: 30.5 pg (ref 27.0–33.0)
MCHC: 33.9 g/dL (ref 32.0–36.0)
MCV: 89.8 fL (ref 80.0–100.0)
MPV: 11.6 fL (ref 7.5–12.5)
Monocytes Relative: 8.5 %
NEUTROS PCT: 64.6 %
Neutro Abs: 3941 cells/uL (ref 1500–7800)
PLATELETS: 206 10*3/uL (ref 140–400)
RBC: 4.82 10*6/uL (ref 4.20–5.80)
RDW: 13 % (ref 11.0–15.0)
TOTAL LYMPHOCYTE: 22.8 %
WBC mixed population: 519 cells/uL (ref 200–950)
WBC: 6.1 10*3/uL (ref 3.8–10.8)

## 2018-07-25 LAB — LIPID PANEL
Cholesterol: 197 mg/dL (ref <200)
HDL: 41 mg/dL (ref >40)
LDL Cholesterol (Calc): 139 mg/dL (calc) — ABNORMAL HIGH
NON-HDL CHOLESTEROL (CALC): 156 mg/dL — AB (ref <130)
Total CHOL/HDL Ratio: 4.8 (calc) (ref <5.0)
Triglycerides: 77 mg/dL (ref <150)

## 2018-08-25 ENCOUNTER — Ambulatory Visit: Payer: BLUE CROSS/BLUE SHIELD | Admitting: Family Medicine

## 2018-09-03 ENCOUNTER — Other Ambulatory Visit: Payer: Self-pay

## 2018-09-08 ENCOUNTER — Other Ambulatory Visit: Payer: Self-pay

## 2018-09-08 MED ORDER — PROPRANOLOL HCL ER 60 MG PO CP24: ORAL_CAPSULE | ORAL | 10 refills | Status: DC

## 2018-09-08 MED ORDER — CELECOXIB 200 MG PO CAPS: 200.0000 mg | ORAL_CAPSULE | Freq: Two times a day (BID) | ORAL | 0 refills | Status: DC | PRN

## 2018-11-18 ENCOUNTER — Other Ambulatory Visit: Payer: Self-pay

## 2018-11-18 MED ORDER — BUPROPION HCL ER (XL) 150 MG PO TB24: 150.0000 mg | ORAL_TABLET | Freq: Every day | ORAL | 1 refills | Status: DC

## 2018-11-22 ENCOUNTER — Other Ambulatory Visit: Payer: Self-pay | Admitting: Family Medicine

## 2018-12-25 ENCOUNTER — Encounter: Payer: Self-pay | Admitting: Family Medicine

## 2018-12-25 ENCOUNTER — Ambulatory Visit: Payer: 59 | Admitting: Family Medicine

## 2018-12-25 VITALS — BP 126/74 | HR 77 | Temp 97.8°F | Resp 12 | Ht 67.0 in | Wt 266.5 lb

## 2018-12-25 DIAGNOSIS — J4520 Mild intermittent asthma, uncomplicated: Secondary | ICD-10-CM

## 2018-12-25 DIAGNOSIS — I10 Essential (primary) hypertension: Secondary | ICD-10-CM | POA: Diagnosis not present

## 2018-12-25 DIAGNOSIS — E559 Vitamin D deficiency, unspecified: Secondary | ICD-10-CM

## 2018-12-25 DIAGNOSIS — E782 Mixed hyperlipidemia: Secondary | ICD-10-CM

## 2018-12-25 DIAGNOSIS — F411 Generalized anxiety disorder: Secondary | ICD-10-CM

## 2018-12-25 DIAGNOSIS — E786 Lipoprotein deficiency: Secondary | ICD-10-CM

## 2018-12-25 DIAGNOSIS — G4733 Obstructive sleep apnea (adult) (pediatric): Secondary | ICD-10-CM

## 2018-12-25 DIAGNOSIS — J3089 Other allergic rhinitis: Secondary | ICD-10-CM

## 2018-12-25 DIAGNOSIS — D509 Iron deficiency anemia, unspecified: Secondary | ICD-10-CM

## 2018-12-25 DIAGNOSIS — E785 Hyperlipidemia, unspecified: Secondary | ICD-10-CM

## 2018-12-25 DIAGNOSIS — E538 Deficiency of other specified B group vitamins: Secondary | ICD-10-CM

## 2018-12-25 DIAGNOSIS — Z5181 Encounter for therapeutic drug level monitoring: Secondary | ICD-10-CM

## 2018-12-25 MED ORDER — FLUTICASONE PROPIONATE 50 MCG/ACT NA SUSP: 2.0000 | Freq: Every day | NASAL | 11 refills | Status: DC | PRN

## 2018-12-25 MED ORDER — BUPROPION HCL ER (XL) 300 MG PO TB24: 300.0000 mg | ORAL_TABLET | Freq: Every day | ORAL | 0 refills | Status: DC

## 2018-12-25 NOTE — Progress Notes (Signed)
BP 126/74   Pulse 77   Temp 97.8 F (36.6 C) (Oral)   Resp 12   Ht 5\' 7"  (5.784 m)   Wt 266 lb 8 oz (120.9 kg)   SpO2 97%   BMI 41.74 kg/m    Subjective:    Patient ID: Tanner Quan., male    DOB: [birth year], 48 y.o.   MRN: [id number]  HPI: Tanner Ryan is a 48 y.o. male  Chief Complaint  Patient presents with  . Follow-up  . Medication Refill  . Depression    wants to change meds    HPI Here for f/u  He wants to change the dose of wellbutrin; edgy Not any more depressed, just irritable His typical sleep pattern is only 5 hours a night; that's been going on for years; lives with 69 year old elderly parent, keeping up with her  Allergic rhinitis; needs refills of the nasal spray; some days are "atrocious"; allergic to everything; four different kinds of grass pollen, trees, dog and cat dander (no pets), pretty much everything that grows in the state of [place]  Asthma; using rescue inhaler only six times in the last several months; pretty well-controlled; temperature swings are a trigger; laughter is not an issue; no pets; perfumes are a trigger, but not in contact very often  Anemia; last CBC was normal but checking b/c he is tired; no blood in the stool; getting more greens  HTN; taking HCTZ; working on cutting on salt  Vitamin D deficiency; tired all the time; vitamin B12 deficiency; not taking any supplements right now; tries to get some sunshine, not enough  High cholesterol; doing better with fried and fast foods; cutting down, maybe once a week; father had significant vascular disease; cardiac and renal stents; father lived to be 73 years old  Obesity; he'd like to get down to 220 pounds; lots of hurdles in getting there; work schedule, has to plan everything because of the shift; likes to cook; time management is an issue; lots of walking, 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; working on more water intake  OSA; using  machine  Depression screen [facility] 2/9 12/25/2018 12/25/2018 07/24/2018 10/14/2017 07/11/2017  Decreased Interest 1 0 0 0 0  Down, Depressed, Hopeless 0 0 0 0 0  PHQ - 2 Score 1 0 0 0 0  Altered sleeping 3 0 3 - -  Tired, decreased energy 3 0 3 - -  Change in appetite 0 0 2 - -  Feeling bad or failure about yourself  0 0 0 - -  Trouble concentrating 1 0 2 - -  Moving slowly or fidgety/restless 0 0 0 - -  Suicidal thoughts 0 0 0 - -  PHQ-9 Score 8 0 10 - -  Difficult doing work/chores Somewhat difficult Not difficult at all Somewhat difficult - -   Fall Risk  12/25/2018 07/24/2018 10/14/2017 07/11/2017 06/06/2017  Falls in the past year? 0 No No No No  Number falls in past yr: 0 - - - -  Injury with Fall? 0 - - - -    Relevant past medical, surgical, family and social history reviewed Past Medical History:  Diagnosis Date  . Allergic rhinitis   . Allergy   . Anemia iron deficiency  . Anxiety   . Arthritis   . Asthma   . Chronic pain of left elbow   . Dysrhythmia   . GERD (gastroesophageal reflux  disease)   . Headache   . Hypertension   . Obese 05/22/2015  . Obesity   . RLS (restless legs syndrome)   . Shortness of breath dyspnea   . Sleep apnea    Past Surgical History:  Procedure Laterality Date  . CHOLECYSTECTOMY N/A 07/25/2015   Procedure: LAPAROSCOPIC CHOLECYSTECTOMY WITH INTRAOPERATIVE CHOLANGIOGRAM;  Surgeon: Florene Glen, MD;  Location: [facility] ORS;  Service: General;  Laterality: N/A;  . COLONOSCOPY WITH ESOPHAGOGASTRODUODENOSCOPY (EGD)  04/28/2014  . CRANIOTOMY  2003  . ELBOW FRACTURE SURGERY  2007   suffered from [place] and had to undo surgical procedure  . TONSILLECTOMY AND ADENOIDECTOMY  1978   Family History  Problem Relation Age of Onset  . Osteoporosis Mother 84  . Heart attack Father 39  . Heart disease Father   . Kidney disease Father   . Drug abuse Sister    Social History   Tobacco Use  . Smoking status: Never Smoker  . Smokeless tobacco: Never Used   Substance Use Topics  . Alcohol use: Yes    Comment: Occasional  . Drug use: No     Office Visit from 12/25/2018 in [facility]  AUDIT-C Score  1      Interim medical history since last visit reviewed. Allergies and medications reviewed  Review of Systems Per HPI unless specifically indicated above     Objective:    BP 126/74   Pulse 77   Temp 97.8 F (36.6 C) (Oral)   Resp 12   Ht 5\' 7"  (1.702 m)   Wt 266 lb 8 oz (120.9 kg)   SpO2 97%   BMI 41.74 kg/m   Wt Readings from Last 3 Encounters:  12/25/18 266 lb 8 oz (120.9 kg)  07/24/18 266 lb 1.6 oz (120.7 kg)  10/14/17 264 lb 14.4 oz (120.2 kg)    Physical Exam Constitutional:      General: He is not in acute distress.    Appearance: He is well-developed. He is obese.  HENT:     Head: Normocephalic and atraumatic.  Eyes:     General: No scleral icterus. Neck:     Thyroid: No thyromegaly.  Cardiovascular:     Rate and Rhythm: Normal rate and regular rhythm.  Pulmonary:     Effort: Pulmonary effort is normal.     Breath sounds: Normal breath sounds.  Abdominal:     General: Bowel sounds are normal. There is no distension.     Palpations: Abdomen is soft.  Skin:    General: Skin is warm and dry.     Coloration: Skin is not pale.  Neurological:     Mental Status: He is alert.     Coordination: Coordination normal.  Psychiatric:        Behavior: Behavior normal.        Thought Content: Thought content normal.        Judgment: Judgment normal.     Results for orders placed or performed in visit on 07/24/18  CBC with Differential/Platelet  Result Value Ref Range   WBC 6.1 3.8 - 10.8 Thousand/uL   RBC 4.82 4.20 - 5.80 Million/uL   Hemoglobin 14.7 13.2 - 17.1 g/dL   HCT 43.3 38.5 - 50.0 %   MCV 89.8 80.0 - 100.0 fL   MCH 30.5 27.0 - 33.0 pg   MCHC 33.9 32.0 - 36.0 g/dL   RDW 13.0 11.0 - 15.0 %   Platelets 206 140 - 400 Thousand/uL  MPV 11.6 7.5 - 12.5 fL   Neutro Abs 3,941 1,500  - 7,800 cells/uL   Lymphs Abs 1,391 850 - 3,900 cells/uL   WBC mixed population 519 200 - 950 cells/uL   Eosinophils Absolute 183 15 - 500 cells/uL   Basophils Absolute 67 0 - 200 cells/uL   Neutrophils Relative % 64.6 %   Total Lymphocyte 22.8 %   Monocytes Relative 8.5 %   Eosinophils Relative 3.0 %   Basophils Relative 1.1 %  Lipid panel  Result Value Ref Range   Cholesterol 197 <200 mg/dL   HDL 41 >40 mg/dL   Triglycerides 77 <150 mg/dL   LDL Cholesterol (Calc) 139 (H) mg/dL (calc)   Total CHOL/HDL Ratio 4.8 <5.0 (calc)   Non-HDL Cholesterol (Calc) 156 (H) <130 mg/dL (calc)  Fe+TIBC+Fer  Result Value Ref Range   Iron 62 50 - 180 mcg/dL   TIBC 373 250 - 425 mcg/dL (calc)   %SAT 17 (L) 20 - 48 % (calc)   Ferritin 48 38 - 380 ng/mL  COMPLETE METABOLIC PANEL WITH GFR  Result Value Ref Range   Glucose, Bld 90 65 - 139 mg/dL   BUN 7 7 - 25 mg/dL   Creat 0.93 0.60 - 1.35 mg/dL   GFR, Est Non African American 98 > OR = 60 mL/min/1.36m2   GFR, Est African American 114 > OR = 60 mL/min/1.21m2   BUN/Creatinine Ratio NOT APPLICABLE 6 - 22 (calc)   Sodium 141 135 - 146 mmol/L   Potassium 3.4 (L) 3.5 - 5.3 mmol/L   Chloride 100 98 - 110 mmol/L   CO2 31 20 - 32 mmol/L   Calcium 9.1 8.6 - 10.3 mg/dL   Total Protein 6.6 6.1 - 8.1 g/dL   Albumin 4.0 3.6 - 5.1 g/dL   Globulin 2.6 1.9 - 3.7 g/dL (calc)   AG Ratio 1.5 1.0 - 2.5 (calc)   Total Bilirubin 0.6 0.2 - 1.2 mg/dL   Alkaline phosphatase (APISO) 66 40 - 115 U/L   AST 24 10 - 40 U/L   ALT 22 9 - 46 U/L      Assessment & Plan:   Problem List Items Addressed This Visit      Cardiovascular and Mediastinum   Benign hypertension    Fair control; continue HCTZ        Respiratory   Obstructive sleep apnea    I believe that significant weight loss would help his sleep apnea; continue use of CPAP      Asthma    Refer to asthma/allergy specialist; well-controlled      Relevant Orders   Ambulatory referral to Allergy    Allergic rhinitis - Primary    Refill of nasal corticosteroid; offered referral to allergist, referral entered      Relevant Orders   Ambulatory referral to Allergy     Other   Morbid obesity ([place])    Encouraged weight loss; discussed bariatric surgery, referral entered; see AVS; trouble shooting for what is standing in his way of reaching his ideal weight      Relevant Orders   Amb Referral to Bariatric Surgery   TSH   Iron deficiency anemia    Check CBC and ferritin panel      Hyperlipidemia    Try to limit saturated fats; check lipids today; runs in the family so we'll see how he does with diet and weight loss      Relevant Orders   Lipid panel   Generalized  anxiety disorder    Increase wellbutrin; call me if needed      Relevant Medications   buPROPion (WELLBUTRIN XL) 300 MG 24 hr tablet   Encounter for medication monitoring    Check electrolytes      Relevant Orders   CBC   COMPLETE METABOLIC PANEL WITH GFR    Other Visit Diagnoses    Low HDL (under 40)       Vitamin D deficiency       Relevant Orders   VITAMIN D 25 Hydroxy (Vit-D Deficiency, Fractures)   Vitamin B12 deficiency       Relevant Orders   Vitamin B12       Follow up plan: No follow-ups on file.  An after-visit summary was printed and given to the patient at [place] [facility].  Please see the patient instructions which may contain other information and recommendations beyond what is mentioned above in the assessment and plan.  Meds ordered this encounter  Medications  . fluticasone (FLONASE) 50 MCG/ACT nasal spray    Sig: Place 2 sprays into both nostrils daily as needed.    Dispense:  16 g    Refill:  11  . buPROPion (WELLBUTRIN XL) 300 MG 24 hr tablet    Sig: Take 1 tablet (300 mg total) by mouth daily.    Dispense:  30 tablet    Refill:  0    Changing dose    Orders Placed This Encounter  Procedures  . CBC  . COMPLETE METABOLIC PANEL WITH GFR  . Lipid panel  . VITAMIN D 25 Hydroxy  (Vit-D Deficiency, Fractures)  . Vitamin B12  . TSH  . Ambulatory referral to Allergy  . Amb Referral to Bariatric Surgery

## 2018-12-25 NOTE — Assessment & Plan Note (Signed)
Increase wellbutrin; call me if needed

## 2018-12-25 NOTE — Assessment & Plan Note (Signed)
Refer to asthma/allergy specialist; well-controlled

## 2018-12-25 NOTE — Assessment & Plan Note (Addendum)
Refill of nasal corticosteroid; offered referral to allergist, referral entered

## 2018-12-25 NOTE — Assessment & Plan Note (Signed)
I believe that significant weight loss would help his sleep apnea; continue use of CPAP

## 2018-12-25 NOTE — Assessment & Plan Note (Signed)
Check electrolytes

## 2018-12-25 NOTE — Assessment & Plan Note (Signed)
Encouraged weight loss; discussed bariatric surgery, referral entered; see AVS; trouble shooting for what is standing in his way of reaching his ideal weight

## 2018-12-25 NOTE — Assessment & Plan Note (Signed)
Check CBC and ferritin panel

## 2018-12-25 NOTE — Patient Instructions (Addendum)
Check out the information at familydoctor.org entitled "Nutrition for Weight Loss: What You Need to Know about Fad Diets" Try to lose between 1-2 pounds per week by taking in fewer calories and burning off more calories You can succeed by limiting portions, limiting foods dense in calories and fat, becoming more active, and drinking 8 glasses of water a day (64 ounces) Don't skip meals, especially breakfast, as skipping meals may alter your metabolism Do not use over-the-counter weight loss pills or gimmicks that claim rapid weight loss A healthy BMI (or body mass index) is between 18.5 and 24.9 You can calculate your ideal BMI at the NIH website http://www.nhlbi.nih.gov/health/educational/lose_wt/BMI/bmicalc.htm  Obesity, Adult Obesity is the condition of having too much total body fat. Being overweight or obese means that your weight is greater than what is considered healthy for your body size. Obesity is determined by a measurement called BMI. BMI is an estimate of body fat and is calculated from height and weight. For adults, a BMI of 30 or higher is considered obese. Obesity can eventually lead to other health concerns and major illnesses, including:  Stroke.  Coronary artery disease (CAD).  Type 2 diabetes.  Some types of cancer, including cancers of the colon, breast, uterus, and gallbladder.  Osteoarthritis.  High blood pressure (hypertension).  High cholesterol.  Sleep apnea.  Gallbladder stones.  Infertility problems. What are the causes? The main cause of obesity is taking in (consuming) more calories than your body uses for energy. Other factors that contribute to this condition may include:  Being born with genes that make you more likely to become obese.  Having a medical condition that causes obesity. These conditions include: ? Hypothyroidism. ? Polycystic ovarian syndrome (PCOS). ? Binge-eating disorder. ? Cushing syndrome.  Taking certain medicines, such  as steroids, antidepressants, and seizure medicines.  Not being physically active (sedentary lifestyle).  Living where there are limited places to exercise safely or buy healthy foods.  Not getting enough sleep. What increases the risk? The following factors may increase your risk of this condition:  Having a family history of obesity.  Being a woman of African-American descent.  Being a man of Hispanic descent. What are the signs or symptoms? Having excessive body fat is the main symptom of this condition. How is this diagnosed? This condition may be diagnosed based on:  Your symptoms.  Your medical history.  A physical exam. Your health care provider may measure: ? Your BMI. If you are an adult with a BMI between 25 and less than 30, you are considered overweight. If you are an adult with a BMI of 30 or higher, you are considered obese. ? The distances around your hips and your waist (circumferences). These may be compared to each other to help diagnose your condition. ? Your skinfold thickness. Your health care provider may gently pinch a fold of your skin and measure it. How is this treated? Treatment for this condition often includes changing your lifestyle. Treatment may include some or all of the following:  Dietary changes. Work with your health care provider and a dietitian to set a weight-loss goal that is healthy and reasonable for you. Dietary changes may include eating: ? Smaller portions. A portion size is the amount of a particular food that is healthy for you to eat at one time. This varies from person to person. ? Low-calorie or low-fat options. ? More whole grains, fruits, and vegetables.  Regular physical activity. This may include aerobic activity (cardio) and   strength training.  Medicine to help you lose weight. Your health care provider may prescribe medicine if you are unable to lose 1 pound a week after 6 weeks of eating more healthily and doing more  physical activity.  Surgery. Surgical options may include gastric banding and gastric bypass. Surgery may be done if: ? Other treatments have not helped to improve your condition. ? You have a BMI of 40 or higher. ? You have life-threatening health problems related to obesity. Follow these instructions at home:  Eating and drinking   Follow recommendations from your health care provider about what you eat and drink. Your health care provider may advise you to: ? Limit fast foods, sweets, and processed snack foods. ? Choose low-fat options, such as low-fat milk instead of whole milk. ? Eat 5 or more servings of fruits or vegetables every day. ? Eat at home more often. This gives you more control over what you eat. ? Choose healthy foods when you eat out. ? Learn what a healthy portion size is. ? Keep low-fat snacks on hand. ? Avoid sugary drinks, such as soda, fruit juice, iced tea sweetened with sugar, and flavored milk. ? Eat a healthy breakfast.  Drink enough water to keep your urine clear or pale yellow.  Do not go without eating for long periods of time (do not fast) or follow a fad diet. Fasting and fad diets can be unhealthy and even dangerous. Physical Activity  Exercise regularly, as told by your health care provider. Ask your health care provider what types of exercise are safe for you and how often you should exercise.  Warm up and stretch before being active.  Cool down and stretch after being active.  Rest between periods of activity. Lifestyle  Limit the time that you spend in front of your TV, computer, or video game system.  Find ways to reward yourself that do not involve food.  Limit alcohol intake to no more than 1 drink a day for nonpregnant women and 2 drinks a day for men. One drink equals 12 oz of beer, 5 oz of wine, or 1 oz of hard liquor. General instructions  Keep a weight loss journal to keep track of the food you eat and how much you exercise you  get.  Take over-the-counter and prescription medicines only as told by your health care provider.  Take vitamins and supplements only as told by your health care provider.  Consider joining a support group. Your health care provider may be able to recommend a support group.  Keep all follow-up visits as told by your health care provider. This is important. Contact a health care provider if:  You are unable to meet your weight loss goal after 6 weeks of dietary and lifestyle changes. This information is not intended to replace advice given to you by your health care provider. Make sure you discuss any questions you have with your health care provider. Document Released: 12/26/2004 Document Revised: 04/22/2016 Document Reviewed: 09/06/2015 Elsevier Interactive Patient Education  2019 Elsevier Inc.  Preventing Unhealthy Weight Gain, Adult Staying at a healthy weight is important to your overall health. When fat builds up in your body, you may become overweight or obese. Being overweight or obese increases your risk of developing certain health problems, such as heart disease, diabetes, sleeping problems, joint problems, and some types of cancer. Unhealthy weight gain is often the result of making unhealthy food choices or not getting enough exercise. You can make changes   to your lifestyle to prevent obesity and stay as healthy as possible. What nutrition changes can be made?   Eat only as much as your body needs. To do this: ? Pay attention to signs that you are hungry or full. Stop eating as soon as you feel full. ? If you feel hungry, try drinking water first before eating. Drink enough water so your urine is clear or pale yellow. ? Eat smaller portions. Pay attention to portion sizes when eating out. ? Look at serving sizes on food labels. Most foods contain more than one serving per container. ? Eat the recommended number of calories for your gender and activity level. For most active  people, a daily total of 2,000 calories is appropriate. If you are trying to lose weight or are not very active, you may need to eat fewer calories. Talk with your health care provider or a diet and nutrition specialist (dietitian) about how many calories you need each day.  Choose healthy foods, such as: ? Fruits and vegetables. At each meal, try to fill at least half of your plate with fruits and vegetables. ? Whole grains, such as whole-wheat bread, brown rice, and quinoa. ? Lean meats, such as chicken or fish. ? Other healthy proteins, such as beans, eggs, or tofu. ? Healthy fats, such as nuts, seeds, fatty fish, and olive oil. ? Low-fat or fat-free dairy products.  Check food labels, and avoid food and drinks that: ? Are high in calories. ? Have added sugar. ? Are high in sodium. ? Have saturated fats or trans fats.  Cook foods in healthier ways, such as by baking, broiling, or grilling.  Make a meal plan for the week, and shop with a grocery list to help you stay on track with your purchases. Try to avoid going to the grocery store when you are hungry.  When grocery shopping, try to shop around the outside of the store first, where the fresh foods are. Doing this helps you to avoid prepackaged foods, which can be high in sugar, salt (sodium), and fat. What lifestyle changes can be made?   Exercise for 30 or more minutes on 5 or more days each week. Exercising may include brisk walking, yard work, biking, running, swimming, and team sports like basketball and soccer. Ask your health care provider which exercises are safe for you.  Do muscle-strengthening activities, such as lifting weights or using resistance bands, on 2 or more days a week.  Do not use any products that contain nicotine or tobacco, such as cigarettes and e-cigarettes. If you need help quitting, ask your health care provider.  Limit alcohol intake to no more than 1 drink a day for nonpregnant women and 2 drinks a  day for men. One drink equals 12 oz of beer, 5 oz of wine, or 1 oz of hard liquor.  Try to get 7-9 hours of sleep each night. What other changes can be made?  Keep a food and activity journal to keep track of: ? What you ate and how many calories you had. Remember to count the calories in sauces, dressings, and side dishes. ? Whether you were active, and what exercises you did. ? Your calorie, weight, and activity goals.  Check your weight regularly. Track any changes. If you notice you have gained weight, make changes to your diet or activity routine.  Avoid taking weight-loss medicines or supplements. Talk to your health care provider before starting any new medicine or supplement.  Talk   to your health care provider before trying any new diet or exercise plan. Why are these changes important? Eating healthy, staying active, and having healthy habits can help you to prevent obesity. Those changes also:  Help you manage stress and emotions.  Help you connect with friends and family.  Improve your self-esteem.  Improve your sleep.  Prevent long-term health problems. What can happen if changes are not made? Being obese or overweight can cause you to develop joint or bone problems, which can make it hard for you to stay active or do activities you enjoy. Being obese or overweight also puts stress on your heart and lungs and can lead to health problems like diabetes, heart disease, and some cancers. Where to find more information Talk with your health care provider or a dietitian about healthy eating and healthy lifestyle choices. You may also find information from:  U.S. Department of Agriculture, MyPlate: www.choosemyplate.gov  American Heart Association: www.heart.org  Centers for Disease Control and Prevention: www.cdc.gov Summary  Staying at a healthy weight is important to your overall health. It helps you to prevent certain diseases and health problems, such as heart  disease, diabetes, joint problems, sleep disorders, and some types of cancer.  Being obese or overweight can cause you to develop joint or bone problems, which can make it hard for you to stay active or do activities you enjoy.  You can prevent unhealthy weight gain by eating a healthy diet, exercising regularly, not smoking, limiting alcohol, and getting enough sleep.  Talk with your health care provider or a dietitian for guidance about healthy eating and healthy lifestyle choices. This information is not intended to replace advice given to you by your health care provider. Make sure you discuss any questions you have with your health care provider. Document Released: 11/19/2016 Document Revised: 08/29/2017 Document Reviewed: 12/25/2016 Elsevier Interactive Patient Education  2019 Elsevier Inc.  

## 2018-12-25 NOTE — Progress Notes (Signed)
Greetings. It was a pleasure to see you today. Your complete blood count is normal. You have normal numbers of white blood cells, red blood cells, and platelets. The other labs are pending. Peace, Dr. Lejla Moeser

## 2018-12-25 NOTE — Assessment & Plan Note (Signed)
Fair control; continue HCTZ

## 2018-12-25 NOTE — Assessment & Plan Note (Signed)
Try to limit saturated fats; check lipids today; runs in the family so we'll see how he does with diet and weight loss

## 2018-12-26 LAB — COMPLETE METABOLIC PANEL WITH GFR
AG Ratio: 1.6 (calc) (ref 1.0–2.5)
ALBUMIN MSPROF: 4.1 g/dL (ref 3.6–5.1)
ALT: 16 U/L (ref 9–46)
AST: 17 U/L (ref 10–40)
Alkaline phosphatase (APISO): 68 U/L (ref 40–115)
BILIRUBIN TOTAL: 0.4 mg/dL (ref 0.2–1.2)
BUN: 11 mg/dL (ref 7–25)
CO2: 34 mmol/L — ABNORMAL HIGH (ref 20–32)
Calcium: 9.2 mg/dL (ref 8.6–10.3)
Chloride: 102 mmol/L (ref 98–110)
Creat: 0.94 mg/dL (ref 0.60–1.35)
GFR, Est African American: 111 mL/min/{1.73_m2} (ref >=60)
GFR, Est Non African American: 96 mL/min/{1.73_m2} (ref >=60)
Globulin: 2.6 g/dL (calc) (ref 1.9–3.7)
Glucose, Bld: 103 mg/dL — ABNORMAL HIGH (ref 65–99)
Potassium: 3.7 mmol/L (ref 3.5–5.3)
Sodium: 142 mmol/L (ref 135–146)
Total Protein: 6.7 g/dL (ref 6.1–8.1)

## 2018-12-26 LAB — IRON,TIBC AND FERRITIN PANEL
%SAT: 14 % (calc) — ABNORMAL LOW (ref 20–48)
Ferritin: 25 ng/mL — ABNORMAL LOW (ref 38–380)
IRON: 56 ug/dL (ref 50–180)
TIBC: 395 mcg/dL (calc) (ref 250–425)

## 2018-12-26 LAB — CBC
HCT: 43.1 % (ref 38.5–50.0)
Hemoglobin: 14.7 g/dL (ref 13.2–17.1)
MCH: 30.2 pg (ref 27.0–33.0)
MCHC: 34.1 g/dL (ref 32.0–36.0)
MCV: 88.7 fL (ref 80.0–100.0)
MPV: 11.2 fL (ref 7.5–12.5)
Platelets: 241 10*3/uL (ref 140–400)
RBC: 4.86 10*6/uL (ref 4.20–5.80)
RDW: 12.2 % (ref 11.0–15.0)
WBC: 7.1 10*3/uL (ref 3.8–10.8)

## 2018-12-26 LAB — LIPID PANEL
Cholesterol: 166 mg/dL (ref <200)
HDL: 37 mg/dL — ABNORMAL LOW (ref >40)
LDL Cholesterol (Calc): 113 mg/dL (calc) — ABNORMAL HIGH
Non-HDL Cholesterol (Calc): 129 mg/dL (calc) (ref <130)
Total CHOL/HDL Ratio: 4.5 (calc) (ref <5.0)
Triglycerides: 73 mg/dL (ref <150)

## 2018-12-26 LAB — VITAMIN D 25 HYDROXY (VIT D DEFICIENCY, FRACTURES): Vit D, 25-Hydroxy: 15 ng/mL — ABNORMAL LOW (ref 30–100)

## 2018-12-26 LAB — VITAMIN B12: Vitamin B-12: 1052 pg/mL (ref 200–1100)

## 2018-12-26 LAB — TSH: TSH: 1.87 m[IU]/L (ref 0.40–4.50)

## 2018-12-29 ENCOUNTER — Other Ambulatory Visit: Payer: Self-pay | Admitting: Family Medicine

## 2018-12-29 DIAGNOSIS — D509 Iron deficiency anemia, unspecified: Secondary | ICD-10-CM

## 2018-12-29 MED ORDER — VITAMIN D (ERGOCALCIFEROL) 1.25 MG (50000 UNIT) PO CAPS: 50000.0000 [IU] | ORAL_CAPSULE | ORAL | 1 refills | Status: DC

## 2018-12-29 NOTE — Progress Notes (Signed)
Start back on Fe and recheck labs in 6 weeks

## 2019-01-08 ENCOUNTER — Other Ambulatory Visit: Payer: Self-pay

## 2019-01-08 MED ORDER — FLUTICASONE PROPIONATE 50 MCG/ACT NA SUSP: 2.0000 | Freq: Every day | NASAL | 3 refills | Status: DC | PRN

## 2019-01-19 ENCOUNTER — Other Ambulatory Visit: Payer: Self-pay | Admitting: Family Medicine

## 2019-01-20 NOTE — Telephone Encounter (Signed)
Left detailed voicemail, pt told to call back if not work or side effects

## 2019-01-20 NOTE — Telephone Encounter (Signed)
Please call patient, make sure the new dose is working well I sent refills but just want to make sure it's effective without side effects

## 2019-01-21 ENCOUNTER — Other Ambulatory Visit: Payer: Self-pay

## 2019-01-21 ENCOUNTER — Other Ambulatory Visit: Payer: Self-pay | Admitting: Family Medicine

## 2019-01-21 MED ORDER — HYDROCHLOROTHIAZIDE 25 MG PO TABS: 25.0000 mg | ORAL_TABLET | Freq: Every day | ORAL | 1 refills | Status: DC

## 2019-01-21 NOTE — Telephone Encounter (Signed)
Copied from [place] [phone]. Topic: Quick Communication - Rx Refill/Question >> Jan 21, 2019  1:49 PM Windy Kalata wrote: Medication: hydrochlorothiazide (HYDRODIURIL) 25 MG tablet, 90 day supply  Has the patient contacted their pharmacy? Yes.   (Agent: If no, request that the patient contact the pharmacy for the refill.) (Agent: If yes, when and what did the pharmacy advise?) Pharmacy called for refill  Preferred Pharmacy (with phone number or street name): [facility], [place] [phone] (Phone) [phone] (Fax)    Agent: Please be advised that RX refills may take up to 3 business days. We ask that you follow-up with your pharmacy.

## 2019-01-21 NOTE — Telephone Encounter (Signed)
Patient is requesting refill- sent for PCP review

## 2019-02-04 DIAGNOSIS — Z8659 Personal history of other mental and behavioral disorders: Secondary | ICD-10-CM | POA: Insufficient documentation

## 2019-03-30 ENCOUNTER — Other Ambulatory Visit: Payer: Self-pay | Admitting: Family Medicine

## 2019-05-19 ENCOUNTER — Other Ambulatory Visit: Payer: Self-pay | Admitting: Family Medicine

## 2019-05-21 ENCOUNTER — Other Ambulatory Visit: Payer: Self-pay

## 2019-05-21 MED ORDER — BUPROPION HCL ER (XL) 300 MG PO TB24: 300.0000 mg | ORAL_TABLET | Freq: Every day | ORAL | 5 refills | Status: DC

## 2019-05-21 NOTE — Telephone Encounter (Signed)
Needs mail order

## 2019-06-21 ENCOUNTER — Other Ambulatory Visit: Payer: Self-pay

## 2019-06-21 MED ORDER — MONTELUKAST SODIUM 10 MG PO TABS: ORAL_TABLET | ORAL | 1 refills | Status: DC

## 2019-06-21 NOTE — Telephone Encounter (Signed)
90 day

## 2019-06-21 NOTE — Telephone Encounter (Signed)
Please schedule routine follow-up in 3 months

## 2019-06-22 NOTE — Telephone Encounter (Signed)
Tried to contact pt (4x) but it would not ring nor was I able to leave a message. It said your able to complete this call at this time.

## 2019-06-25 ENCOUNTER — Ambulatory Visit: Payer: 59 | Admitting: Family Medicine

## 2019-07-25 ENCOUNTER — Other Ambulatory Visit: Payer: Self-pay | Admitting: Family Medicine

## 2019-07-26 NOTE — Telephone Encounter (Signed)
Patient needs routine appointment within the next 3 months

## 2019-07-27 NOTE — Telephone Encounter (Signed)
Have tried both numbers on file (2x) and not able to get through.

## 2019-08-03 ENCOUNTER — Telehealth: Payer: Self-pay | Admitting: Family Medicine

## 2019-08-03 NOTE — Telephone Encounter (Signed)
Please set up routine appointment in the next 90 days.

## 2019-08-03 NOTE — Telephone Encounter (Signed)
Pt has an appt on 08/10/19 with Kristeen Miss

## 2019-08-10 ENCOUNTER — Encounter: Payer: Self-pay | Admitting: Family Medicine

## 2019-08-10 ENCOUNTER — Ambulatory Visit (INDEPENDENT_AMBULATORY_CARE_PROVIDER_SITE_OTHER): Payer: Managed Care, Other (non HMO) | Admitting: Family Medicine

## 2019-08-10 ENCOUNTER — Other Ambulatory Visit: Payer: Self-pay

## 2019-08-10 VITALS — BP 128/82 | HR 70 | Temp 97.9°F | Resp 14 | Ht 67.0 in | Wt 261.5 lb

## 2019-08-10 DIAGNOSIS — E782 Mixed hyperlipidemia: Secondary | ICD-10-CM | POA: Diagnosis not present

## 2019-08-10 DIAGNOSIS — I10 Essential (primary) hypertension: Secondary | ICD-10-CM

## 2019-08-10 DIAGNOSIS — G4726 Circadian rhythm sleep disorder, shift work type: Secondary | ICD-10-CM

## 2019-08-10 DIAGNOSIS — Z760 Encounter for issue of repeat prescription: Secondary | ICD-10-CM

## 2019-08-10 DIAGNOSIS — E611 Iron deficiency: Secondary | ICD-10-CM

## 2019-08-10 DIAGNOSIS — J4530 Mild persistent asthma, uncomplicated: Secondary | ICD-10-CM

## 2019-08-10 DIAGNOSIS — G2581 Restless legs syndrome: Secondary | ICD-10-CM

## 2019-08-10 DIAGNOSIS — F411 Generalized anxiety disorder: Secondary | ICD-10-CM

## 2019-08-10 DIAGNOSIS — Z23 Encounter for immunization: Secondary | ICD-10-CM | POA: Diagnosis not present

## 2019-08-10 DIAGNOSIS — E559 Vitamin D deficiency, unspecified: Secondary | ICD-10-CM

## 2019-08-10 DIAGNOSIS — Z8679 Personal history of other diseases of the circulatory system: Secondary | ICD-10-CM | POA: Diagnosis not present

## 2019-08-10 DIAGNOSIS — G4700 Insomnia, unspecified: Secondary | ICD-10-CM

## 2019-08-10 DIAGNOSIS — G43109 Migraine with aura, not intractable, without status migrainosus: Secondary | ICD-10-CM

## 2019-08-10 MED ORDER — HYDROCHLOROTHIAZIDE 25 MG PO TABS: 25.0000 mg | ORAL_TABLET | Freq: Every day | ORAL | 3 refills | Status: DC

## 2019-08-10 MED ORDER — MONTELUKAST SODIUM 10 MG PO TABS: ORAL_TABLET | ORAL | 11 refills | Status: DC

## 2019-08-10 MED ORDER — PROPRANOLOL HCL ER 60 MG PO CP24: ORAL_CAPSULE | ORAL | 11 refills | Status: DC

## 2019-08-10 MED ORDER — BUPROPION HCL ER (XL) 150 MG PO TB24: 150.0000 mg | ORAL_TABLET | Freq: Every day | ORAL | 2 refills | Status: AC

## 2019-08-10 MED ORDER — ZOLPIDEM TARTRATE 5 MG PO TABS: 5.0000 mg | ORAL_TABLET | Freq: Every evening | ORAL | 0 refills | Status: DC | PRN

## 2019-08-10 NOTE — Assessment & Plan Note (Addendum)
Well controlled on HCTZ and propanolol Needs to return for labs (fasting)

## 2019-08-10 NOTE — Assessment & Plan Note (Signed)
Better with supplements

## 2019-08-10 NOTE — Progress Notes (Signed)
Name: Tanner Ryan.   MRN: [id number]    DOB: [birth year]   Date:08/10/2019       Progress Note  Chief Complaint  Patient presents with  . Follow-up  . Medication Refill     Subjective:   Tanner Ryan. is a 48 y.o. male, presents to clinic for routine follow up and medication refills.  When reviewing problem list and asking pt what he needs to address today he says "all of it" however there are about 20 Dx on the list, and pt states he only comes once a year.  Pt's PCP unfortunately retired, so he is establishing care with new PCP and pt is new to me.   He works as a Production designer, theatre/television/film   Anxiety:  better after a divorce, his relationship with child is better with better communications his anxiety is much better, but he does work in a high stress environment constantly dealing with severe/extreme and devastating situations.   On Wellbutrin 300 mg daily and he wants to decrease or d/c meds  Migraines: Patient has history of migraines he uses Tylenol and over-the-counter medications first-line and does have supplied Imitrex at home that he also uses which is effective to abort the migraines.  He is inquiring about injectables for migraine treatment.  He has not been to neurology, he gets about 3-5 headaches a month.  Usually does not have any nausea or vomiting that prevents him from taking oral medications. Imitrex - right now, he now uses tylenol first for HA's and then imitrex if needed  HTN: On HCTZ 25 mg, pt states its well controlled with that and propranolol which she takes for history of SVT.  He denies any near syncope, palpitations, chest pain, shortness of breath, lower extremity edema.  Asthma:   Patient rarely uses his inhaler only about 3 times a year and usually in the springtime he does not want a refill on the inhaler right now, he will request it when needed which he expects will be a neck spring  Allergic Rhinitis/seasonal allergies: Allergies are mostly seasonal  although he does continue to use Flonase and Singulair year-round he will add on extra over-the-counter medications during different seasons with worsening symptoms right now he does not feel congested he does not have any postnasal drip  Obesity:   Has been referred to weight management and is currently working towards bariatric surgery if he can meet all the requirements he anticipates getting it done before the end of the year.  I attempted to review their OV and records through care everywhere with the pt in the room today - he has pending labs, and we tried to see what they were so we could see if they would be covered here today, but I am unable to see the pending orders.    Patient also has a history of anemia iron deficiency anemia and restless leg syndrome.  His symptoms did improve with iron supplementation and he has not had labs since last year. He does continue to have trouble sleeping but his restless leg symptoms are better and its more so due to third shift with 12-hour shifts and alternating schedule he has a lot of trouble getting to sleep and staying asleep.  He had used Ambien briefly before the past it was very effective in helping him get some sleep.   Per chart review he does have a history of hyperlipidemia but is not on medications due to being low risk  for his age and prior ASCVD risk calculator = 3.1% 10 year risk He also had very low vit D previously completed a prescription vitamin D supplement is not currently on any over-the-counter vitamin D supplements.  Patient Active Problem List   Diagnosis Date Noted  . Hyperlipidemia 12/25/2018  . Insomnia 10/14/2017  . Snores 10/14/2017  . Encounter for medication monitoring 07/04/2016  . Right elbow pain 07/04/2016  . GERD (gastroesophageal reflux disease) 07/04/2016  . Morbid obesity ([place]) 04/28/2016  . 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 History of PSVT (paroxysmal supraventricular tachycardia)  11/05/2015  . Arthralgia of both elbows 09/08/2015  . Arthralgia of both knees 09/08/2015  . Iron deficiency anemia 07/24/2015  . Benign hypertension 05/22/2015  . Allergic rhinitis 05/22/2015  . Obstructive sleep apnea 05/22/2015  . Generalized anxiety disorder 05/22/2015  . RLS (restless legs syndrome) 05/22/2015  . Microcytic hypochromic anemia 05/22/2015  . Asthma 05/22/2015  . Migraine 05/22/2015    Past Surgical History:  Procedure Laterality Date  . CHOLECYSTECTOMY N/A 07/25/2015   Procedure: LAPAROSCOPIC CHOLECYSTECTOMY WITH INTRAOPERATIVE CHOLANGIOGRAM;  Surgeon: Florene Glen, MD;  Location: [facility] ORS;  Service: General;  Laterality: N/A;  . COLONOSCOPY WITH ESOPHAGOGASTRODUODENOSCOPY (EGD)  04/28/2014  . CRANIOTOMY  2003  . ELBOW FRACTURE SURGERY  2007   suffered from [place] and had to undo surgical procedure  . TONSILLECTOMY AND ADENOIDECTOMY  1978    Family History  Problem Relation Age of Onset  . Osteoporosis Mother 68  . Heart attack Father 39  . Heart disease Father   . Kidney disease Father   . Drug abuse Sister     Social History   Socioeconomic History  . Marital status: Divorced    Spouse name: Not on file  . Number of children: Not on file  . Years of education: Not on file  . Highest education level: Not on file  Occupational History  . Not on file  Social Needs  . Financial resource strain: Not on file  . Food insecurity    Worry: Not on file    Inability: Not on file  . Transportation needs    Medical: Not on file    Non-medical: Not on file  Tobacco Use  . Smoking status: Never Smoker  . Smokeless tobacco: Never Used  Substance and Sexual Activity  . Alcohol use: Yes    Comment: Occasional  . Drug use: No  . Sexual activity: Not Currently  Lifestyle  . Physical activity    Days per week: Not on file    Minutes per session: Not on file  . Stress: Not on file  Relationships  . Social Herbalist on phone: Not on file     Gets together: Not on file    Attends religious service: Not on file    Active member of club or organization: Not on file    Attends meetings of clubs or organizations: Not on file    Relationship status: Not on file  . Intimate partner violence    Fear of current or ex partner: Not on file    Emotionally abused: Not on file    Physically abused: Not on file    Forced sexual activity: Not on file  Other Topics Concern  . Not on file  Social History Narrative  . Not on file     Current Outpatient Medications:  .  acetaminophen (TYLENOL) 650 MG CR tablet, Take 650  mg by mouth every 6 (six) hours as needed for pain., Disp: , Rfl:  .  albuterol (PROVENTIL HFA;VENTOLIN HFA) 108 (90 Base) MCG/ACT inhaler, Inhale 2 puffs into the lungs every 4 (four) hours as needed for wheezing or shortness of breath., Disp: 1 Inhaler, Rfl: 2 .  budesonide-formoterol (SYMBICORT) 160-4.5 MCG/ACT inhaler, Inhale 2 puffs into the lungs 2 (two) times daily. Stop flovent, Disp: 1 Inhaler, Rfl: 11 .  buPROPion (WELLBUTRIN XL) 300 MG 24 hr tablet, Take 1 tablet (300 mg total) by mouth daily., Disp: 30 tablet, Rfl: 5 .  fluticasone (FLONASE) 50 MCG/ACT nasal spray, Place 2 sprays into both nostrils daily as needed., Disp: 48 g, Rfl: 3 .  hydrochlorothiazide (HYDRODIURIL) 25 MG tablet, TAKE 1 TABLET DAILY, Disp: 90 tablet, Rfl: 0 .  montelukast (SINGULAIR) 10 MG tablet, TAKE 1 TABLET BY MOUTH EVERY DAY, Disp: 30 tablet, Rfl: 11 .  propranolol ER (INDERAL LA) 60 MG 24 hr capsule, TAKE 1 CAPSULE (60 MG TOTAL) BY MOUTH DAILY. FOR SVT AND MIGRAINE PREVENTION **STOP METOPROLOL**, Disp: 30 capsule, Rfl: 11 .  SUMAtriptan (IMITREX) 100 MG tablet, One tablet by mouth at onset of migraine; may repeat in 2 hours if headache persists or recurs. Max of 2 per 24 hours, Disp: 12 tablet, Rfl: 2 .  traZODone (DESYREL) 100 MG tablet, Take 0.5-1 tablets (50-100 mg total) at bedtime by mouth., Disp: 90 tablet, Rfl: 1 .  celecoxib  (CELEBREX) 200 MG capsule, Take 1 capsule (200 mg total) by mouth 2 (two) times daily as needed. (Patient not taking: Reported on 12/25/2018), Disp: 60 capsule, Rfl: 0 .  pramipexole (MIRAPEX) 0.5 MG tablet, Take 1 tablet (0.5 mg total) at bedtime as needed by mouth., Disp: 90 tablet, Rfl: 3 .  promethazine (PHENERGAN) 12.5 MG tablet, Take 1 tablet (12.5 mg total) by mouth every 6 (six) hours as needed for nausea. (be aware of sedation) (Patient not taking: Reported on 07/24/2018), Disp: 20 tablet, Rfl: 1 .  Vitamin D, Ergocalciferol, (DRISDOL) 1.25 MG (50000 UT) CAPS capsule, Take 1 capsule (50,000 Units total) by mouth every 7 (seven) days. (Patient not taking: Reported on 08/10/2019), Disp: 4 capsule, Rfl: 1  Allergies  Allergen Reactions  . Aspirin Other (See Comments)    GI issues  . Penicillins Nausea And Vomiting    I personally reviewed active problem list, medication list, allergies, family history, social history, health maintenance, notes from last encounter, lab results, imaging with the patient/caregiver today.  Review of Systems  Constitutional: Negative.   HENT: Negative.   Eyes: Negative.   Respiratory: Negative.   Cardiovascular: Negative.   Gastrointestinal: Negative.   Endocrine: Negative.   Genitourinary: Negative.   Musculoskeletal: Negative.   Skin: Negative.   Allergic/Immunologic: Negative.   Neurological: Negative.   Hematological: Negative.   Psychiatric/Behavioral: Negative.   All other systems reviewed and are negative.    Objective:    Vitals:   08/10/19 1539  BP: 128/82  Pulse: 70  Resp: 14  Temp: 97.9 F (36.6 C)  SpO2: 97%  Weight: 261 lb 8 oz (118.6 kg)  Height: 5\' 7"  (1.702 m)    Body mass index is 40.96 kg/m.  Physical Exam Vitals signs and nursing note reviewed.  Constitutional:      General: He is not in acute distress.    Appearance: He is well-developed. He is obese. He is not ill-appearing, toxic-appearing or diaphoretic.   HENT:     Head: Normocephalic and atraumatic.  Right Ear: External ear normal.     Left Ear: External ear normal.     Nose: Nose normal.     Mouth/Throat:     Mouth: Mucous membranes are moist.     Pharynx: Oropharynx is clear.  Eyes:     General: No scleral icterus.       Right eye: No discharge.        Left eye: No discharge.     Conjunctiva/sclera: Conjunctivae normal.     Pupils: Pupils are equal, round, and reactive to light.  Neck:     Trachea: No tracheal deviation.  Cardiovascular:     Rate and Rhythm: Normal rate and regular rhythm.     Pulses: Normal pulses.     Heart sounds: Normal heart sounds.  Pulmonary:     Effort: Pulmonary effort is normal. No respiratory distress.     Breath sounds: Normal breath sounds. No stridor.  Abdominal:     General: Bowel sounds are normal. There is no distension.     Palpations: Abdomen is soft.     Tenderness: There is no abdominal tenderness.  Musculoskeletal: Normal range of motion.  Skin:    General: Skin is warm and dry.     Capillary Refill: Capillary refill takes less than 2 seconds.     Coloration: Skin is not jaundiced or pale.     Findings: No bruising or rash.  Neurological:     Mental Status: He is alert.     Motor: No weakness or abnormal muscle tone.     Coordination: Coordination normal.     Gait: Gait normal.  Psychiatric:        Mood and Affect: Mood normal.        Behavior: Behavior normal.       PHQ2/9: Depression screen [facility] 2/9 08/10/2019 12/25/2018 12/25/2018 07/24/2018 10/14/2017  Decreased Interest 1 1 0 0 0  Down, Depressed, Hopeless 2 0 0 0 0  PHQ - 2 Score 3 1 0 0 0  Altered sleeping 0 3 0 3 -  Tired, decreased energy 1 3 0 3 -  Change in appetite 0 0 0 2 -  Feeling bad or failure about yourself  0 0 0 0 -  Trouble concentrating 0 1 0 2 -  Moving slowly or fidgety/restless 0 0 0 0 -  Suicidal thoughts 0 0 0 0 -  PHQ-9 Score 4 8 0 10 -  Difficult doing work/chores Somewhat difficult Somewhat  difficult Not difficult at all Somewhat difficult -    phq 9 is mildly positive - currently treated, and improving from last PHQ 9    Fall Risk: Fall Risk  08/10/2019 12/25/2018 07/24/2018 10/14/2017 07/11/2017  Falls in the past year? 0 0 No No No  Number falls in past yr: 0 0 - - -  Injury with Fall? 0 0 - - -      Functional Status Survey: Is the patient deaf or have difficulty hearing?: No Does the patient have difficulty seeing, even when wearing glasses/contacts?: Yes Does the patient have difficulty concentrating, remembering, or making decisions?: No Does the patient have difficulty walking or climbing stairs?: No Does the patient have difficulty dressing or bathing?: No Does the patient have difficulty doing errands alone such as visiting a doctor's office or shopping?: No    Assessment & Plan:   Problem List Items Addressed This Visit      Cardiovascular and Mediastinum   Hypertension - Primary  Well controlled on HCTZ and propanolol Needs to return for labs (fasting)      Relevant Medications   propranolol ER (INDERAL LA) 60 MG 24 hr capsule   hydrochlorothiazide (HYDRODIURIL) 25 MG tablet   Migraine    Well controlled with OTC meds and oral imitrex, only a few HA's per month, continue same management      Relevant Medications   propranolol ER (INDERAL LA) 60 MG 24 hr capsule   buPROPion (WELLBUTRIN XL) 150 MG 24 hr tablet   hydrochlorothiazide (HYDRODIURIL) 25 MG tablet     Respiratory   Asthma    Mild intermittent, only with high pollen/spring or very hot and humid. Only uses SABA PRN, very rarely      Relevant Medications   montelukast (SINGULAIR) 10 MG tablet     Other   Generalized anxiety disorder    Improving, wishing to decrease meds Decrease wellbutrin from 300 mg daily to 150 mg daily      Relevant Medications   buPROPion (WELLBUTRIN XL) 150 MG 24 hr tablet   RLS (restless legs syndrome)    Better with supplements      Relevant  Orders   COMPLETE METABOLIC PANEL WITH GFR   History of PSVT (paroxysmal supraventricular tachycardia)    Well controlled on propanolol, no SE      Relevant Medications   propranolol ER (INDERAL LA) 60 MG 24 hr capsule   Morbid obesity (HCC)    Seeing bariatric specialist/weight management Weight stable, no significant improvement over the past 2 years, hoping to do sleeve gastrectomy by the end of the year        Relevant Orders   CBC with Differential/Platelet   Lipid panel   Hemoglobin A1c   Insomnia   Relevant Medications   zolpidem (AMBIEN) 5 MG tablet   Mixed hyperlipidemia    Diet controlled, seeing bariatric specialist Will return for FLP      Relevant Medications   propranolol ER (INDERAL LA) 60 MG 24 hr capsule   hydrochlorothiazide (HYDRODIURIL) 25 MG tablet   Other Relevant Orders   COMPLETE METABOLIC PANEL WITH GFR   Lipid panel    Other Visit Diagnoses    Vitamin D deficiency       did prescription and then not supplementing, recheck labs   Relevant Orders   VITAMIN D 25 Hydroxy (Vit-D Deficiency, Fractures)   Iron deficiency       had been supplementing for deficiency, no anemia H/H stable normal, recheck labs   Relevant Orders   CBC with Differential/Platelet   Need for influenza vaccination       Relevant Orders   Flu Vaccine QUAD 6+ mos PF IM (Fluarix Quad PF) (Completed)   Medication refill       Relevant Medications   propranolol ER (INDERAL LA) 60 MG 24 hr capsule   montelukast (SINGULAIR) 10 MG tablet   buPROPion (WELLBUTRIN XL) 150 MG 24 hr tablet   hydrochlorothiazide (HYDRODIURIL) 25 MG tablet   Sleep disorder, shift-work       short trial of ambien   Relevant Medications   zolpidem (AMBIEN) 5 MG tablet      Greater than 50% of this visit was spent in direct face-to-face counseling, obtaining history and physical, discussing and educating pt on treatment plan, addressing numerous conditions today.  Total time of this visit was 45 min.   Remainder of time involved but was not limited to reviewing chart (recent and pertinent OV notes and  labs), documentation in EMR, and coordinating care and treatment plan. Pt only wants to come once a year to address everything, but he was encouraged to come at least 2x a year for routine visits and an additional time for an annual well visit.     Return in about 6 months (around 02/07/2020) for Routine follow-up.   Delsa Grana, PA-C 08/10/19 3:50 PM

## 2019-08-10 NOTE — Assessment & Plan Note (Signed)
Well controlled on propanolol, no SE

## 2019-08-10 NOTE — Assessment & Plan Note (Signed)
Mild intermittent, only with high pollen/spring or very hot and humid. Only uses SABA PRN, very rarely

## 2019-08-10 NOTE — Patient Instructions (Signed)
Please let me know how you do with decreasing wellbutrin dose  Return as soon as you can for fasting labs

## 2019-08-12 ENCOUNTER — Encounter: Payer: Self-pay | Admitting: Family Medicine

## 2019-08-12 NOTE — Assessment & Plan Note (Signed)
Improving, wishing to decrease meds Decrease wellbutrin from 300 mg daily to 150 mg daily

## 2019-08-12 NOTE — Assessment & Plan Note (Signed)
Diet controlled, seeing bariatric specialist Will return for FLP

## 2019-08-12 NOTE — Assessment & Plan Note (Signed)
Seeing bariatric specialist/weight management Weight stable, no significant improvement over the past 2 years, hoping to do sleeve gastrectomy by the end of the year

## 2019-08-12 NOTE — Assessment & Plan Note (Signed)
Well controlled with OTC meds and oral imitrex, only a few HA's per month, continue same management

## 2019-09-13 ENCOUNTER — Other Ambulatory Visit: Payer: Self-pay

## 2019-09-13 DIAGNOSIS — Z8679 Personal history of other diseases of the circulatory system: Secondary | ICD-10-CM

## 2019-09-13 DIAGNOSIS — Z760 Encounter for issue of repeat prescription: Secondary | ICD-10-CM

## 2019-09-13 DIAGNOSIS — I10 Essential (primary) hypertension: Secondary | ICD-10-CM

## 2019-09-13 MED ORDER — PROPRANOLOL HCL ER 60 MG PO CP24: ORAL_CAPSULE | ORAL | 11 refills | Status: DC

## 2019-09-13 NOTE — Telephone Encounter (Signed)
Ins request 90 day

## 2019-10-08 ENCOUNTER — Other Ambulatory Visit: Payer: Self-pay | Admitting: Family Medicine

## 2019-10-08 DIAGNOSIS — G4700 Insomnia, unspecified: Secondary | ICD-10-CM

## 2019-10-08 DIAGNOSIS — G4726 Circadian rhythm sleep disorder, shift work type: Secondary | ICD-10-CM

## 2019-10-27 ENCOUNTER — Encounter: Payer: Self-pay | Admitting: Family Medicine

## 2019-10-27 MED ORDER — SUMATRIPTAN SUCCINATE 6 MG/0.5ML ~~LOC~~ SOAJ: 6.0000 mg | Freq: Once | SUBCUTANEOUS | 1 refills | Status: AC | PRN

## 2019-11-16 ENCOUNTER — Other Ambulatory Visit: Payer: Self-pay | Admitting: Family Medicine

## 2019-11-16 NOTE — Telephone Encounter (Signed)
Requested medication (s) are due for refill today:  Yes  Requested medication (s) are on the active medication list:   Yes   150 mg or 300 mg  Future visit scheduled:   No   Last ordered: 08/10/2019  #30  2 refills   Returned because not sure which dose to refill.   150 mg or 300 mg?  Requested Prescriptions  Pending Prescriptions Disp Refills   buPROPion (WELLBUTRIN XL) 300 MG 24 hr tablet [Pharmacy Med Name: BUPROPION HCL XL 300 MG TABLET] 30 tablet 5    Sig: TAKE 1 TABLET BY MOUTH EVERY DAY      Psychiatry: Antidepressants - bupropion Passed - 11/16/2019 12:23 AM      Passed - Completed PHQ-2 or PHQ-9 in the last 360 days.      Passed - Last BP in normal range    BP Readings from Last 1 Encounters:  08/10/19 128/82          Passed - Valid encounter within last 6 months    Recent Outpatient Visits           3 months ago Benign hypertension   [facility] Delsa Grana, PA-C   10 months ago Non-seasonal allergic rhinitis, unspecified trigger   [facility] [place], Satira Anis, MD   1 year ago Intermittent asthma without complication, unspecified asthma severity   [place], Satira Anis, MD   2 years ago Insomnia, unspecified type   [place], Satira Anis, MD   2 years ago Preventative health care   [facility] [place], Satira Anis, MD

## 2019-11-17 ENCOUNTER — Other Ambulatory Visit: Payer: Self-pay

## 2019-11-17 DIAGNOSIS — I10 Essential (primary) hypertension: Secondary | ICD-10-CM

## 2019-11-17 DIAGNOSIS — Z760 Encounter for issue of repeat prescription: Secondary | ICD-10-CM

## 2019-11-17 MED ORDER — HYDROCHLOROTHIAZIDE 25 MG PO TABS: 25.0000 mg | ORAL_TABLET | Freq: Every day | ORAL | 1 refills | Status: DC

## 2019-12-27 ENCOUNTER — Other Ambulatory Visit: Payer: Self-pay

## 2019-12-27 DIAGNOSIS — J4530 Mild persistent asthma, uncomplicated: Secondary | ICD-10-CM

## 2019-12-27 DIAGNOSIS — Z760 Encounter for issue of repeat prescription: Secondary | ICD-10-CM

## 2019-12-27 MED ORDER — MONTELUKAST SODIUM 10 MG PO TABS: ORAL_TABLET | ORAL | 1 refills | Status: DC

## 2020-01-25 ENCOUNTER — Other Ambulatory Visit: Payer: Self-pay

## 2020-01-25 DIAGNOSIS — Z8679 Personal history of other diseases of the circulatory system: Secondary | ICD-10-CM

## 2020-01-25 DIAGNOSIS — Z760 Encounter for issue of repeat prescription: Secondary | ICD-10-CM

## 2020-01-25 DIAGNOSIS — I10 Essential (primary) hypertension: Secondary | ICD-10-CM

## 2020-01-25 MED ORDER — PROPRANOLOL HCL ER 60 MG PO CP24: ORAL_CAPSULE | ORAL | 2 refills | Status: DC

## 2020-01-25 NOTE — Telephone Encounter (Signed)
Mail order needs to be 90 day suupply

## 2020-01-31 ENCOUNTER — Other Ambulatory Visit: Payer: Self-pay | Admitting: Family Medicine

## 2020-01-31 DIAGNOSIS — G4700 Insomnia, unspecified: Secondary | ICD-10-CM

## 2020-01-31 DIAGNOSIS — G4726 Circadian rhythm sleep disorder, shift work type: Secondary | ICD-10-CM

## 2020-02-01 NOTE — Telephone Encounter (Signed)
[phone]

## 2020-02-13 ENCOUNTER — Other Ambulatory Visit: Payer: Self-pay | Admitting: Family Medicine

## 2020-02-13 NOTE — Telephone Encounter (Signed)
Requested Prescriptions  Pending Prescriptions Disp Refills  . fluticasone (FLONASE) 50 MCG/ACT nasal spray [Pharmacy Med Name: FLUTICASONE PROP NASAL SPRAY 16GM 50MCG] 48 g 1    Sig: USE 2 SPRAYS IN EACH NOSTRIL DAILY AS NEEDED     Ear, Nose, and Throat: Nasal Preparations - Corticosteroids Passed - 02/13/2020  6:37 AM      Passed - Valid encounter within last 12 months    Recent Outpatient Visits          6 months ago Benign hypertension   [facility] Delsa Grana, PA-C   1 year ago Non-seasonal allergic rhinitis, unspecified trigger   [facility] [place], Satira Anis, MD   1 year ago Intermittent asthma without complication, unspecified asthma severity   [place], Satira Anis, MD   2 years ago Insomnia, unspecified type   [place], Satira Anis, MD   2 years ago Preventative health care   [facility], Satira Anis, MD

## 2020-04-12 ENCOUNTER — Telehealth: Payer: Self-pay | Admitting: Family Medicine

## 2020-04-12 ENCOUNTER — Other Ambulatory Visit: Payer: Self-pay | Admitting: Family Medicine

## 2020-04-12 MED ORDER — BUPROPION HCL ER (XL) 300 MG PO TB24: 300.0000 mg | ORAL_TABLET | Freq: Every day | ORAL | 0 refills | Status: AC

## 2020-04-12 NOTE — Telephone Encounter (Signed)
Medication Refill - Medication: Bupropion/90 day supply 300mg   Has the patient contacted their pharmacy? Yes.   (Agent: If no, request that the patient contact the pharmacy for the refill.) (Agent: If yes, when and what did the pharmacy advise?)  Preferred Pharmacy (with phone number or street name): [facility]   Agent: Please be advised that RX refills may take up to 3 business days. We ask that you follow-up with your pharmacy.

## 2020-04-12 NOTE — Telephone Encounter (Signed)
Medication Refill - Medication: Bupropion/90 day supply/300mg   Has the patient contacted their pharmacy? Yes.   (Agent: If no, request that the patient contact the pharmacy for the refill.) (Agent: If yes, when and what did the pharmacy advise?)  Preferred Pharmacy (with phone number or street name): [facility]   Agent: Please be advised that RX refills may take up to 3 business days. We ask that you follow-up with your pharmacy.

## 2020-04-12 NOTE — Addendum Note (Signed)
Addended by: Linus Orn A on: 04/12/2020 03:38 PM   Modules accepted: Orders

## 2020-04-12 NOTE — Telephone Encounter (Signed)
Attempted to call pt. Could not get through. Courtesy refill. Message sent to pharmacy.

## 2020-04-29 ENCOUNTER — Other Ambulatory Visit: Payer: Self-pay | Admitting: Family Medicine

## 2020-04-29 DIAGNOSIS — I10 Essential (primary) hypertension: Secondary | ICD-10-CM

## 2020-04-29 DIAGNOSIS — Z760 Encounter for issue of repeat prescription: Secondary | ICD-10-CM

## 2020-06-24 ENCOUNTER — Other Ambulatory Visit: Payer: Self-pay | Admitting: Family Medicine

## 2020-06-24 DIAGNOSIS — J4530 Mild persistent asthma, uncomplicated: Secondary | ICD-10-CM

## 2020-06-24 DIAGNOSIS — Z760 Encounter for issue of repeat prescription: Secondary | ICD-10-CM

## 2020-07-01 ENCOUNTER — Other Ambulatory Visit: Payer: Self-pay | Admitting: Family Medicine

## 2020-08-14 ENCOUNTER — Other Ambulatory Visit: Payer: Self-pay

## 2020-08-14 MED ORDER — FLUTICASONE PROPIONATE 50 MCG/ACT NA SUSP: NASAL | 3 refills | Status: AC

## 2020-09-07 ENCOUNTER — Other Ambulatory Visit: Payer: Self-pay | Admitting: Family Medicine

## 2020-09-07 DIAGNOSIS — Z760 Encounter for issue of repeat prescription: Secondary | ICD-10-CM

## 2020-09-07 DIAGNOSIS — I10 Essential (primary) hypertension: Secondary | ICD-10-CM

## 2020-09-07 DIAGNOSIS — Z8679 Personal history of other diseases of the circulatory system: Secondary | ICD-10-CM

## 2020-09-13 ENCOUNTER — Other Ambulatory Visit: Payer: Self-pay | Admitting: Family Medicine

## 2020-09-13 DIAGNOSIS — I10 Essential (primary) hypertension: Secondary | ICD-10-CM

## 2020-09-13 DIAGNOSIS — Z8679 Personal history of other diseases of the circulatory system: Secondary | ICD-10-CM

## 2020-09-13 DIAGNOSIS — Z760 Encounter for issue of repeat prescription: Secondary | ICD-10-CM

## 2020-09-25 ENCOUNTER — Other Ambulatory Visit: Payer: Self-pay | Admitting: Family Medicine

## 2020-09-25 DIAGNOSIS — Z760 Encounter for issue of repeat prescription: Secondary | ICD-10-CM

## 2020-09-25 DIAGNOSIS — I10 Essential (primary) hypertension: Secondary | ICD-10-CM

## 2020-09-25 DIAGNOSIS — Z8679 Personal history of other diseases of the circulatory system: Secondary | ICD-10-CM

## 2020-10-08 ENCOUNTER — Other Ambulatory Visit: Payer: Self-pay | Admitting: Family Medicine

## 2020-10-26 ENCOUNTER — Other Ambulatory Visit: Payer: Self-pay | Admitting: Family Medicine

## 2020-10-26 DIAGNOSIS — I10 Essential (primary) hypertension: Secondary | ICD-10-CM

## 2020-10-26 DIAGNOSIS — Z760 Encounter for issue of repeat prescription: Secondary | ICD-10-CM

## 2020-11-03 ENCOUNTER — Other Ambulatory Visit: Payer: Self-pay | Admitting: Family Medicine

## 2020-11-03 DIAGNOSIS — I10 Essential (primary) hypertension: Secondary | ICD-10-CM

## 2020-11-03 DIAGNOSIS — Z760 Encounter for issue of repeat prescription: Secondary | ICD-10-CM

## 2020-11-07 ENCOUNTER — Ambulatory Visit: Payer: 59 | Admitting: Family Medicine

## 2021-05-30 ENCOUNTER — Other Ambulatory Visit: Payer: Self-pay | Admitting: Family Medicine

## 2021-05-30 DIAGNOSIS — I10 Essential (primary) hypertension: Secondary | ICD-10-CM

## 2021-05-30 DIAGNOSIS — Z760 Encounter for issue of repeat prescription: Secondary | ICD-10-CM

## 2021-05-30 NOTE — Telephone Encounter (Signed)
Patient needs appt

## 2021-05-30 NOTE — Telephone Encounter (Signed)
Tried calling the patient to let him  know that he needs an appt before his refill can be done. Appt can be done with any provider that has an open
